# Patient Record
Sex: Male | Born: 1940 | Race: White | Hispanic: No | Marital: Married | Smoking: Current some day smoker
Health system: Southern US, Community
[De-identification: ages and names within clinical notes are randomized; demographics above are authoritative.]

## PROBLEM LIST (undated history)

## (undated) DIAGNOSIS — E785 Hyperlipidemia, unspecified: Secondary | ICD-10-CM

## (undated) DIAGNOSIS — F329 Major depressive disorder, single episode, unspecified: Secondary | ICD-10-CM

## (undated) DIAGNOSIS — E119 Type 2 diabetes mellitus without complications: Secondary | ICD-10-CM

## (undated) DIAGNOSIS — F419 Anxiety disorder, unspecified: Secondary | ICD-10-CM

## (undated) DIAGNOSIS — I714 Abdominal aortic aneurysm, without rupture, unspecified: Secondary | ICD-10-CM

## (undated) DIAGNOSIS — N433 Hydrocele, unspecified: Secondary | ICD-10-CM

## (undated) DIAGNOSIS — R55 Syncope and collapse: Secondary | ICD-10-CM

## (undated) DIAGNOSIS — F32A Depression, unspecified: Secondary | ICD-10-CM

## (undated) DIAGNOSIS — I10 Essential (primary) hypertension: Secondary | ICD-10-CM

## (undated) HISTORY — PX: BLEPHAROPLASTY: SUR158

## (undated) HISTORY — DX: Abdominal aortic aneurysm, without rupture: I71.4

## (undated) HISTORY — DX: Hydrocele, unspecified: N43.3

## (undated) HISTORY — DX: Type 2 diabetes mellitus without complications: E11.9

## (undated) HISTORY — DX: Anxiety disorder, unspecified: F41.9

## (undated) HISTORY — DX: Syncope and collapse: R55

## (undated) HISTORY — DX: Depression, unspecified: F32.A

## (undated) HISTORY — DX: Hyperlipidemia, unspecified: E78.5

## (undated) HISTORY — DX: Essential (primary) hypertension: I10

## (undated) HISTORY — DX: Abdominal aortic aneurysm, without rupture, unspecified: I71.40

## (undated) HISTORY — DX: Major depressive disorder, single episode, unspecified: F32.9

---

## 1970-10-31 HISTORY — PX: TONSILECTOMY, ADENOIDECTOMY, BILATERAL MYRINGOTOMY AND TUBES: SHX2538

## 2012-10-31 HISTORY — PX: FACIAL COSMETIC SURGERY: SHX629

## 2013-12-12 ENCOUNTER — Other Ambulatory Visit (HOSPITAL_COMMUNITY): Payer: Self-pay | Admitting: Internal Medicine

## 2013-12-12 DIAGNOSIS — K3184 Gastroparesis: Secondary | ICD-10-CM

## 2013-12-24 ENCOUNTER — Encounter (HOSPITAL_COMMUNITY)
Admission: RE | Admit: 2013-12-24 | Discharge: 2013-12-24 | Disposition: A | Discharge status: Home / Self Care | Payer: Medicare Other | Source: Ambulatory Visit | Attending: Internal Medicine | Admitting: Internal Medicine

## 2013-12-24 DIAGNOSIS — K3184 Gastroparesis: Secondary | ICD-10-CM | POA: Insufficient documentation

## 2013-12-24 MED ORDER — TECHNETIUM TC 99M SULFUR COLLOID
2.0000 | Freq: Once | INTRAVENOUS | Status: AC | PRN
  Administered 2013-12-24: 2 via INTRAVENOUS

## 2015-05-07 ENCOUNTER — Encounter: Payer: Self-pay | Admitting: Internal Medicine

## 2015-05-07 ENCOUNTER — Other Ambulatory Visit (INDEPENDENT_AMBULATORY_CARE_PROVIDER_SITE_OTHER): Payer: Medicare Other

## 2015-05-07 ENCOUNTER — Ambulatory Visit (INDEPENDENT_AMBULATORY_CARE_PROVIDER_SITE_OTHER): Payer: Medicare Other | Admitting: Internal Medicine

## 2015-05-07 VITALS — BP 130/80 | HR 80 | Temp 97.6°F | Ht 73.75 in | Wt 218.5 lb

## 2015-05-07 DIAGNOSIS — I714 Abdominal aortic aneurysm, without rupture, unspecified: Secondary | ICD-10-CM

## 2015-05-07 DIAGNOSIS — N508 Other specified disorders of male genital organs: Secondary | ICD-10-CM

## 2015-05-07 DIAGNOSIS — E119 Type 2 diabetes mellitus without complications: Secondary | ICD-10-CM

## 2015-05-07 DIAGNOSIS — E785 Hyperlipidemia, unspecified: Secondary | ICD-10-CM | POA: Insufficient documentation

## 2015-05-07 DIAGNOSIS — N442 Benign cyst of testis: Secondary | ICD-10-CM

## 2015-05-07 DIAGNOSIS — N433 Hydrocele, unspecified: Secondary | ICD-10-CM

## 2015-05-07 DIAGNOSIS — Z23 Encounter for immunization: Secondary | ICD-10-CM

## 2015-05-07 DIAGNOSIS — I10 Essential (primary) hypertension: Secondary | ICD-10-CM

## 2015-05-07 LAB — COMPREHENSIVE METABOLIC PANEL
ALT: 14 U/L (ref 0–53)
AST: 11 U/L (ref 0–37)
Albumin: 4.3 g/dL (ref 3.5–5.2)
Alkaline Phosphatase: 66 U/L (ref 39–117)
BUN: 14 mg/dL (ref 6–23)
CALCIUM: 10 mg/dL (ref 8.4–10.5)
CHLORIDE: 102 meq/L (ref 96–112)
CO2: 28 mEq/L (ref 19–32)
Creatinine, Ser: 1.18 mg/dL (ref 0.40–1.50)
GFR: 64.58 mL/min (ref >60.00)
Glucose, Bld: 183 mg/dL — ABNORMAL HIGH (ref 70–99)
POTASSIUM: 4.4 meq/L (ref 3.5–5.1)
Sodium: 139 mEq/L (ref 135–145)
TOTAL PROTEIN: 7.2 g/dL (ref 6.0–8.3)
Total Bilirubin: 0.5 mg/dL (ref 0.2–1.2)

## 2015-05-07 LAB — LIPID PANEL
CHOL/HDL RATIO: 3
Cholesterol: 152 mg/dL (ref 0–200)
HDL: 48.5 mg/dL (ref >39.00)
LDL Cholesterol: 88 mg/dL (ref 0–99)
NONHDL: 103.5
Triglycerides: 76 mg/dL (ref 0.0–149.0)
VLDL: 15.2 mg/dL (ref 0.0–40.0)

## 2015-05-07 LAB — HEMOGLOBIN A1C: HEMOGLOBIN A1C: 8.1 % — AB (ref 4.6–6.5)

## 2015-05-07 NOTE — Progress Notes (Signed)
   Subjective:    Patient ID: Dwayne Barnett, male    DOB: 1941/10/12, 74 y.o.   MRN: [id number]  HPI The patient is a 74 YO man who is coming in for treatment of his diabetes as well as a few other concerns. He had scrotal US at [facility] in February with a cyst on a testicle and it needed follow up in about 6 months and this is almost due. He also has a AAA which he cannot remember the size or when it was last checked. He is a formed smoker and stopped about 2 months ago. He does take oral medicines for his diabetes. Recent eye exam normal. No numbness or burning in his feet. No known complications that he knows of. No hypoglycemia or hyperglycemia. Last HgA1c 7.2 from February of this year.   PMH, [facility], social history reviewed and updated at visit.   Review of Systems  Constitutional: Negative for fever, activity change, appetite change, fatigue and unexpected weight change.  HENT: Negative.   Eyes: Negative.   Respiratory: Negative for cough, chest tightness, shortness of breath and wheezing.   Cardiovascular: Negative for chest pain, palpitations and leg swelling.  Gastrointestinal: Negative for nausea, abdominal pain, diarrhea, constipation and abdominal distention.       Rare GERD  Musculoskeletal: Negative.   Skin: Negative.   Neurological: Negative.   Psychiatric/Behavioral: Negative.       Objective:   Physical Exam  Constitutional: He is oriented to person, place, and time. He appears well-developed and well-nourished.  HENT:  Head: Normocephalic and atraumatic.  Eyes: EOM are normal.  Neck: Normal range of motion.  Cardiovascular: Normal rate and regular rhythm.   Carotids without bruit  Pulmonary/Chest: Effort normal and breath sounds normal. No respiratory distress. He has no wheezes. He has no rales.  Abdominal: Soft. Bowel sounds are normal. He exhibits no distension. There is no tenderness. There is no rebound.  Musculoskeletal: He exhibits no edema.  Neurological: He is  alert and oriented to person, place, and time. Coordination normal.  Skin: Skin is warm and dry.  Psychiatric: He has a normal mood and affect.   Filed Vitals:   05/07/15 0830  BP: 130/80  Pulse: 80  Temp: 97.6 F (36.4 C)  TempSrc: Oral  Height: 6' 1.75" (1.873 m)  Weight: 218 lb 8 oz (99.111 kg)  SpO2: 95%      Assessment & Plan:  Prevnar 13 given at visit.

## 2015-05-07 NOTE — Progress Notes (Signed)
Pre visit review using our clinic review tool, if applicable. No additional management support is needed unless otherwise documented below in the visit note. 

## 2015-05-07 NOTE — Assessment & Plan Note (Signed)
.  5cm by .6cm left testicle simple cyst. Also epididymis with .7cm by .5cm cyst on imaging from [facility] 12/12/14 with need for follow up in 6 months. Will order today. No pain.

## 2015-05-07 NOTE — Assessment & Plan Note (Signed)
BP at goal today. Continue lisinopril 40 mg daily and metoprolol 50 mg BID. Checking BMP and adjust as needed.

## 2015-05-07 NOTE — Assessment & Plan Note (Signed)
Currently taking metformin 1000 mg bid and januvia. Checking HgA1c and BMP today. Eye exam done for the year will obtain. Foot exam today normal. On ACE-I. No known complications.

## 2015-05-07 NOTE — Assessment & Plan Note (Signed)
Checking for increase in size with ultrasound. Can place referral to urology if he wants to talk about intervention. He will think about and let me know.

## 2015-05-07 NOTE — Assessment & Plan Note (Signed)
Continue crestor 40 mg daily, checking lipid panel and adjust as needed.

## 2015-05-07 NOTE — Assessment & Plan Note (Signed)
He cannot remember the last time this was checked or the size. He is a former smoker (quit 2 months ago). No signs of rupture on exam today and not palpable.

## 2015-05-07 NOTE — Patient Instructions (Signed)
We will check the blood work today and call you back with the results.   We have ordered the ultrasound to check the abdominal aortic aneurysm for size. We have also ordered the ultrasound of the scrotum to follow up on the cyst.   We will see you back in about 6 months. If you have problems or questions sooner please call the office.   Diabetes and Standards of Medical Care Diabetes is complicated. You may find that your diabetes team includes a dietitian, nurse, diabetes educator, eye doctor, and more. To help everyone know what is going on and to help you get the care you deserve, the following schedule of care was developed to help keep you on track. Below are the tests, exams, vaccines, medicines, education, and plans you will need. HbA1c test This test shows how well you have controlled your glucose over the past 2-3 months. It is used to see if your diabetes management plan needs to be adjusted.   It is performed at least 2 times a year if you are meeting treatment goals.  It is performed 4 times a year if therapy has changed or if you are not meeting treatment goals. Blood pressure test  This test is performed at every routine medical visit. The goal is less than 140/90 mm Hg for most people, but 130/80 mm Hg in some cases. Ask your health care provider about your goal. Dental exam  Follow up with the dentist regularly. Eye exam  If you are diagnosed with type 1 diabetes as a child, get an exam upon reaching the age of 12 years or older and have had diabetes for 3-5 years. Yearly eye exams are recommended after that initial eye exam.  If you are diagnosed with type 1 diabetes as an adult, get an exam within 5 years of diagnosis and then yearly.  If you are diagnosed with type 2 diabetes, get an exam as soon as possible after the diagnosis and then yearly. Foot care exam  Visual foot exams are performed at every routine medical visit. The exams check for cuts, injuries, or other  problems with the feet.  A comprehensive foot exam should be done yearly. This includes visual inspection as well as assessing foot pulses and testing for loss of sensation.  Check your feet nightly for cuts, injuries, or other problems with your feet. Tell your health care provider if anything is not healing. Kidney function test (urine microalbumin)  This test is performed once a year.  Type 1 diabetes: The first test is performed 5 years after diagnosis.  Type 2 diabetes: The first test is performed at the time of diagnosis.  A serum creatinine and estimated glomerular filtration rate (eGFR) test is done once a year to assess the level of chronic kidney disease (CKD), if present. Lipid profile (cholesterol, HDL, LDL, triglycerides)  Performed every 5 years for most people.  The goal for LDL is less than 100 mg/dL. If you are at high risk, the goal is less than 70 mg/dL.  The goal for HDL is 40 mg/dL-50 mg/dL for men and 50 mg/dL-60 mg/dL for women. An HDL cholesterol of 60 mg/dL or higher gives some protection against heart disease.  The goal for triglycerides is less than 150 mg/dL. Influenza vaccine, pneumococcal vaccine, and hepatitis B vaccine  The influenza vaccine is recommended yearly.  It is recommended that people with diabetes who are over 37 years old get the pneumonia vaccine. In some cases, two separate  shots may be given. Ask your health care provider if your pneumonia vaccination is up to date.  The hepatitis B vaccine is also recommended for adults with diabetes. Diabetes self-management education  Education is recommended at diagnosis and ongoing as needed. Treatment plan  Your treatment plan is reviewed at every medical visit. Document Released: 08/14/2009 Document Revised: 03/03/2014 Document Reviewed: 03/19/2013 [facility] Patient Information 2015 [place], [place]. This information is not intended to replace advice given to you by your health care provider.  Make sure you discuss any questions you have with your health care provider.

## 2015-05-11 ENCOUNTER — Encounter: Payer: Self-pay | Admitting: Internal Medicine

## 2015-05-12 ENCOUNTER — Encounter: Payer: Self-pay | Admitting: Internal Medicine

## 2015-05-12 ENCOUNTER — Other Ambulatory Visit: Payer: Self-pay | Admitting: Geriatric Medicine

## 2015-05-12 MED ORDER — METFORMIN HCL 1000 MG PO TABS: 1000.0000 mg | ORAL_TABLET | Freq: Two times a day (BID) | ORAL | Status: AC

## 2015-05-15 ENCOUNTER — Ambulatory Visit
Admission: RE | Admit: 2015-05-15 | Discharge: 2015-05-15 | Disposition: A | Discharge status: Home / Self Care | Payer: Medicare Other | Source: Ambulatory Visit | Attending: Internal Medicine | Admitting: Internal Medicine

## 2015-05-15 DIAGNOSIS — N442 Benign cyst of testis: Secondary | ICD-10-CM

## 2015-07-07 ENCOUNTER — Telehealth: Payer: Self-pay | Admitting: Internal Medicine

## 2015-07-07 NOTE — Telephone Encounter (Signed)
Pt called to let you know that [facility] will be sending request in for Crestor and Wellbutrin

## 2015-08-26 ENCOUNTER — Telehealth: Payer: Self-pay | Admitting: *Deleted

## 2015-08-26 NOTE — Telephone Encounter (Signed)
Left msg on triage stating saw md back in July did not have PSA check with labs, and haven't had the abd. Aortic check in a year. Would like to have these test done. Pls advise...Dwayne Barnett

## 2015-08-27 NOTE — Telephone Encounter (Signed)
Called pt gave md response. Made appt for 09/01/15.Marland KitchenJohny Barnett

## 2015-08-27 NOTE — Telephone Encounter (Signed)
Pls advise on msg below.../lmb 

## 2015-08-27 NOTE — Telephone Encounter (Signed)
The PSA is not recommended as a screening lab, if he has an indication for it otherwise he should have brought that up at the visit. Would need a visit to discuss the indication. We have not gotten his records and he was unable to tell us the last time when his abd aneurysm was checked last.

## 2015-09-01 ENCOUNTER — Ambulatory Visit: Payer: Medicare Other | Admitting: Internal Medicine

## 2015-09-03 ENCOUNTER — Encounter: Payer: Self-pay | Admitting: Internal Medicine

## 2015-09-03 ENCOUNTER — Ambulatory Visit (INDEPENDENT_AMBULATORY_CARE_PROVIDER_SITE_OTHER): Payer: Medicare Other | Admitting: Internal Medicine

## 2015-09-03 ENCOUNTER — Other Ambulatory Visit (INDEPENDENT_AMBULATORY_CARE_PROVIDER_SITE_OTHER): Payer: Medicare Other

## 2015-09-03 VITALS — BP 150/82 | HR 89 | Temp 98.2°F | Resp 14 | Ht 73.5 in | Wt 211.4 lb

## 2015-09-03 DIAGNOSIS — Z23 Encounter for immunization: Secondary | ICD-10-CM

## 2015-09-03 DIAGNOSIS — I714 Abdominal aortic aneurysm, without rupture, unspecified: Secondary | ICD-10-CM

## 2015-09-03 DIAGNOSIS — Z1211 Encounter for screening for malignant neoplasm of colon: Secondary | ICD-10-CM | POA: Diagnosis not present

## 2015-09-03 DIAGNOSIS — R972 Elevated prostate specific antigen [PSA]: Secondary | ICD-10-CM | POA: Insufficient documentation

## 2015-09-03 LAB — PSA: PSA: 4.99 ng/mL — ABNORMAL HIGH (ref 0.10–4.00)

## 2015-09-03 NOTE — Assessment & Plan Note (Addendum)
Still have not gotten records but probably time for US aorta for screening and have ordered today. Unknown last date of study and unknown last size.

## 2015-09-03 NOTE — Assessment & Plan Note (Signed)
Checking PSA today, referral to urology if needed. Has had biopsy in the past.

## 2015-09-03 NOTE — Progress Notes (Signed)
   Subjective:    Patient ID: Dwayne Barnett, male    DOB: 1941-02-07, 74 y.o.   MRN: [id number]  HPI The patient is a 74 YO man coming in with several concerns. His first is his AAA which has not been checked in some time (possibly several years although he is unclear about size and when it was checked). He was supposed to get it at the [place] but this was delayed. The second is that he did not get a PSA with his labs last visit. He has had elevations in the past and biopsy in the past. His brother has problems with prostate cancer and he is very concerned about it. The next thing is that he wants a colonoscopy and has not had one in more than 10 years (not sure when the last one was but had a couple benign polyps removed, done in [place]).   Review of Systems  Constitutional: Negative for fever, activity change, appetite change, fatigue and unexpected weight change.  Respiratory: Negative for cough, chest tightness, shortness of breath and wheezing.   Cardiovascular: Negative for chest pain, palpitations and leg swelling.  Gastrointestinal: Negative for nausea, abdominal pain, diarrhea, constipation and abdominal distention.  Musculoskeletal: Negative.   Skin: Negative.   Neurological: Negative.   Psychiatric/Behavioral: Negative.      Objective:   Physical Exam  Constitutional: He is oriented to person, place, and time. He appears well-developed and well-nourished.  HENT:  Head: Normocephalic and atraumatic.  Eyes: EOM are normal.  Neck: Normal range of motion.  Cardiovascular: Normal rate and regular rhythm.   Carotids without bruit  Pulmonary/Chest: Effort normal and breath sounds normal. No respiratory distress. He has no wheezes. He has no rales.  Abdominal: Soft. Bowel sounds are normal. He exhibits no distension and no mass. There is no tenderness. There is no rebound.  Musculoskeletal: He exhibits no edema.  Neurological: He is alert and oriented to person, place, and time.  Coordination normal.  Skin: Skin is warm and dry.  Psychiatric: He has a normal mood and affect.   Filed Vitals:   09/03/15 1103  BP: 150/82  Pulse: 89  Temp: 98.2 F (36.8 C)  TempSrc: Oral  Resp: 14  Height: 6' 1.5" (1.867 m)  Weight: 211 lb 6.4 oz (95.89 kg)  SpO2: 98%      Assessment & Plan:  Flu and tdap given at visit.

## 2015-09-03 NOTE — Progress Notes (Signed)
Pre visit review using our clinic review tool, if applicable. No additional management support is needed unless otherwise documented below in the visit note. 

## 2015-09-03 NOTE — Patient Instructions (Signed)
We will check the PSA today and call you back with the results.   We will make sure that you get the ultrasound here as well as the colonoscopy.

## 2015-09-04 DIAGNOSIS — Z23 Encounter for immunization: Secondary | ICD-10-CM

## 2015-09-04 NOTE — Addendum Note (Signed)
Addended by: Resa Miner R on: 09/04/2015 04:02 PM   Modules accepted: Orders

## 2015-09-11 ENCOUNTER — Ambulatory Visit
Admission: RE | Admit: 2015-09-11 | Discharge: 2015-09-11 | Disposition: A | Discharge status: Home / Self Care | Payer: Medicare Other | Source: Ambulatory Visit | Attending: Internal Medicine | Admitting: Internal Medicine

## 2015-09-11 DIAGNOSIS — I714 Abdominal aortic aneurysm, without rupture, unspecified: Secondary | ICD-10-CM

## 2015-09-14 ENCOUNTER — Encounter: Payer: Self-pay | Admitting: Internal Medicine

## 2015-11-06 ENCOUNTER — Other Ambulatory Visit (INDEPENDENT_AMBULATORY_CARE_PROVIDER_SITE_OTHER): Payer: Medicare Other

## 2015-11-06 ENCOUNTER — Encounter: Payer: Self-pay | Admitting: Internal Medicine

## 2015-11-06 ENCOUNTER — Ambulatory Visit (INDEPENDENT_AMBULATORY_CARE_PROVIDER_SITE_OTHER): Payer: Medicare Other | Admitting: Internal Medicine

## 2015-11-06 VITALS — BP 130/80 | HR 86 | Temp 98.4°F | Resp 20 | Ht 74.0 in | Wt 217.0 lb

## 2015-11-06 DIAGNOSIS — E119 Type 2 diabetes mellitus without complications: Secondary | ICD-10-CM

## 2015-11-06 LAB — HEMOGLOBIN A1C: HEMOGLOBIN A1C: 8.2 % — AB (ref 4.6–6.5)

## 2015-11-06 NOTE — Patient Instructions (Signed)
We will check the labs for the diabetes today and call you back with the results.   Diabetes and Standards of Medical Care Diabetes is complicated. You may find that your diabetes team includes a dietitian, nurse, diabetes educator, eye doctor, and more. To help everyone know what is going on and to help you get the care you deserve, the following schedule of care was developed to help keep you on track. Below are the tests, exams, vaccines, medicines, education, and plans you will need. HbA1c test This test shows how well you have controlled your glucose over the past 2-3 months. It is used to see if your diabetes management plan needs to be adjusted.   It is performed at least 2 times a year if you are meeting treatment goals.  It is performed 4 times a year if therapy has changed or if you are not meeting treatment goals. Blood pressure test  This test is performed at every routine medical visit. The goal is less than 140/90 mm Hg for most people, but 130/80 mm Hg in some cases. Ask your health care provider about your goal. Dental exam  Follow up with the dentist regularly. Eye exam  If you are diagnosed with type 1 diabetes as a child, get an exam upon reaching the age of 97 years or older and having had diabetes for 3-5 years. Yearly eye exams are recommended after that initial eye exam.  If you are diagnosed with type 1 diabetes as an adult, get an exam within 5 years of diagnosis and then yearly.  If you are diagnosed with type 2 diabetes, get an exam as soon as possible after the diagnosis and then yearly. Foot care exam  Visual foot exams are performed at every routine medical visit. The exams check for cuts, injuries, or other problems with the feet.  You should have a complete foot exam performed every year. This exam includes an inspection of the structure and skin of your feet, a check of the pulses in your feet, and a check of the sensation in your feet.  Type 1 diabetes:  The first exam is performed 5 years after diagnosis.  Type 2 diabetes: The first exam is performed at the time of diagnosis.  Check your feet nightly for cuts, injuries, or other problems with your feet. Tell your health care provider if anything is not healing. Kidney function test (urine microalbumin)  This test is performed once a year.  Type 1 diabetes: The first test is performed 5 years after diagnosis.  Type 2 diabetes: The first test is performed at the time of diagnosis.  A serum creatinine and estimated glomerular filtration rate (eGFR) test is done once a year to assess the level of chronic kidney disease (CKD), if present. Lipid profile (cholesterol, HDL, LDL, triglycerides)  Performed every 5 years for most people.  The goal for LDL is less than 100 mg/dL. If you are at high risk, the goal is less than 70 mg/dL.  The goal for HDL is 40 mg/dL-50 mg/dL for men and 50 mg/dL-60 mg/dL for women. An HDL cholesterol of 60 mg/dL or higher gives some protection against heart disease.  The goal for triglycerides is less than 150 mg/dL. Immunizations  The flu (influenza) vaccine is recommended yearly for every person 41 months of age or older who has diabetes.  The pneumonia (pneumococcal) vaccine is recommended for every person 35 years of age or older who has diabetes. Adults 27 years of age  or older may receive the pneumonia vaccine as a series of two separate shots.  The hepatitis B vaccine is recommended for adults shortly after they have been diagnosed with diabetes.  The Tdap (tetanus, diphtheria, and pertussis) vaccine should be given:  According to normal childhood vaccination schedules, for children.  Every 10 years, for adults who have diabetes. Diabetes self-management education  Education is recommended at diagnosis and ongoing as needed. Treatment plan  Your treatment plan is reviewed at every medical visit.   This information is not intended to replace advice  given to you by your health care provider. Make sure you discuss any questions you have with your health care provider.   Document Released: 08/14/2009 Document Revised: 11/07/2014 Document Reviewed: 03/19/2013 Elsevier Interactive Patient Education [employer].

## 2015-11-06 NOTE — Progress Notes (Signed)
   Subjective:    Patient ID: Dwayne Barnett, male    DOB: 09-25-1941, 75 y.o.   MRN: [id number]  HPI The patient is a 75 YO man coming in for follow up of his diabetes. He is still taking his medicine and exercising 3 times per week. No numbness in his feet, gets his eyes checked yearly. No new problems. No low sugars.   Review of Systems  Constitutional: Negative for fever, activity change, appetite change, fatigue and unexpected weight change.  Respiratory: Negative for cough, chest tightness, shortness of breath and wheezing.   Cardiovascular: Negative for chest pain, palpitations and leg swelling.  Gastrointestinal: Negative for nausea, abdominal pain, diarrhea, constipation and abdominal distention.  Musculoskeletal: Negative.   Skin: Negative.   Neurological: Negative.   Psychiatric/Behavioral: Negative.       Objective:   Physical Exam  Constitutional: He is oriented to person, place, and time. He appears well-developed and well-nourished.  HENT:  Head: Normocephalic and atraumatic.  Eyes: EOM are normal.  Neck: Normal range of motion.  Cardiovascular: Normal rate and regular rhythm.   Carotids without bruit  Pulmonary/Chest: Effort normal and breath sounds normal. No respiratory distress. He has no wheezes. He has no rales.  Abdominal: Soft. Bowel sounds are normal. He exhibits no distension and no mass. There is no tenderness. There is no rebound.  Musculoskeletal: He exhibits no edema.  Neurological: He is alert and oriented to person, place, and time. Coordination normal.  Skin: Skin is warm and dry.  Psychiatric: He has a normal mood and affect.   Filed Vitals:   11/06/15 1012  BP: 130/80  Pulse: 86  Temp: 98.4 F (36.9 C)  TempSrc: Oral  Resp: 20  Height: '6\' 2"'$  (1.88 m)  Weight: 217 lb (98.431 kg)  SpO2: 98%      Assessment & Plan:

## 2015-11-06 NOTE — Assessment & Plan Note (Signed)
Checking HgA1c, on metformin and januvia. Not complicated. On ACE-I and statin.

## 2015-11-06 NOTE — Progress Notes (Signed)
Pre visit review using our clinic review tool, if applicable. No additional management support is needed unless otherwise documented below in the visit note. 

## 2015-11-19 ENCOUNTER — Other Ambulatory Visit: Payer: Self-pay | Admitting: Geriatric Medicine

## 2015-11-19 ENCOUNTER — Telehealth: Payer: Self-pay

## 2015-11-19 MED ORDER — LISINOPRIL 40 MG PO TABS: 40.0000 mg | ORAL_TABLET | Freq: Every day | ORAL | Status: AC

## 2015-11-19 NOTE — Telephone Encounter (Signed)
[facility] r[facility] for lisinopril 90 day supply

## 2015-11-19 NOTE — Telephone Encounter (Signed)
Sent to pharmacy 

## 2015-11-24 ENCOUNTER — Encounter: Payer: Medicare Other | Admitting: Internal Medicine

## 2016-01-21 ENCOUNTER — Encounter: Payer: Self-pay | Admitting: Internal Medicine

## 2016-01-21 ENCOUNTER — Telehealth: Payer: Self-pay

## 2016-01-21 ENCOUNTER — Ambulatory Visit (INDEPENDENT_AMBULATORY_CARE_PROVIDER_SITE_OTHER): Payer: Medicare Other | Admitting: Internal Medicine

## 2016-01-21 VITALS — BP 132/62 | HR 79 | Temp 97.8°F | Resp 12 | Ht 73.5 in | Wt 213.4 lb

## 2016-01-21 DIAGNOSIS — IMO0001 Reserved for inherently not codable concepts without codable children: Secondary | ICD-10-CM | POA: Insufficient documentation

## 2016-01-21 DIAGNOSIS — Z0000 Encounter for general adult medical examination without abnormal findings: Secondary | ICD-10-CM | POA: Diagnosis not present

## 2016-01-21 DIAGNOSIS — E119 Type 2 diabetes mellitus without complications: Secondary | ICD-10-CM

## 2016-01-21 DIAGNOSIS — R143 Flatulence: Secondary | ICD-10-CM | POA: Diagnosis not present

## 2016-01-21 DIAGNOSIS — Z87891 Personal history of nicotine dependence: Secondary | ICD-10-CM

## 2016-01-21 DIAGNOSIS — N433 Hydrocele, unspecified: Secondary | ICD-10-CM

## 2016-01-21 NOTE — Patient Instructions (Addendum)
We will send you to the urologist and the GI doctor.   We will have you decrease the metformin to 1/2 pill twice a day to see if that improves the gas.    Dwayne Barnett , Thank you for taking time to come for your Medicare Wellness Visit. I appreciate your ongoing commitment to your health goals. Please review the following plan we discussed and let me know if I can assist you in the future.   [name] (Primary care doctor on [address] [facility]  Address: [address], [place], [place] [address] Phone: [phone]  Will check with [name] regarding Zostavax Educated to check with insurance regarding coverage of Shingles vaccination on Part D or Part B and may have lower co-pay if provided on the Part D side  Important Safety Information ZOSTAVAX does not protect everyone, so some people who get the vaccine may still get Shingles.  You should not get ZOSTAVAX if you are allergic to any of its ingredients, including gelatin or neomycin, have a weakened immune system, take high doses of steroids, or are pregnant or plan to become pregnant. You should not get ZOSTAVAX to prevent chickenpox.  Talk to your health care professional if you plan to get ZOSTAVAX at the same time as PNEUMOVAX23 (Pneumococcal Vaccine Polyvalent) because it may be better to get these vaccines at least 4 weeks apart.  Possible side effects include redness, pain, itching, swelling, hard lump, warmth, or bruising at the injection site, as well as headache.  ZOSTAVAX contains a weakened chickenpox virus. Tell your health care professional if you will be in close contact with newborn infants, someone who may be pregnant and has not had chickenpox or been vaccinated against chickenpox, or someone who has problems with their immune system. Your health care professional can tell you what situations you may need to avoid. You are encouraged to report negative side effects of prescription drugs to the FDA. Visit  SmoothHits.hu, or call 1-800-FDA-1088.  Goal:  To stay hit fit and continue to stop smoking  These are the goals we discussed: Goals    None      This is a list of the screening recommended for you and due dates:  Health Maintenance  Topic Date Due  . Shingles Vaccine  10/22/2003  . Hemoglobin A1C  05/05/2016  . Complete foot exam   05/06/2016  . Flu Shot  05/31/2016  . Eye exam for diabetics  01/13/2017  . Colon Cancer Screening  01/12/2021  . Tetanus Vaccine  09/02/2025  . Pneumonia vaccines  Completed    Health Maintenance, Male A healthy lifestyle and preventative care can promote health and wellness.  Maintain regular health, dental, and eye exams.  Eat a healthy diet. Foods like vegetables, fruits, whole grains, low-fat dairy products, and lean protein foods contain the nutrients you need and are low in calories. Decrease your intake of foods high in solid fats, added sugars, and salt. Get information about a proper diet from your health care provider, if necessary.  Regular physical exercise is one of the most important things you can do for your health. Most adults should get at least 150 minutes of moderate-intensity exercise (any activity that increases your heart rate and causes you to sweat) each week. In addition, most adults need muscle-strengthening exercises on 2 or more days a week.   Maintain a healthy weight. The body mass index (BMI) is a screening tool to identify possible weight problems.  It provides an estimate of body fat based on height and weight. Your health care provider can find your BMI and can help you achieve or maintain a healthy weight. For males 20 years and older:  A BMI below 18.5 is considered underweight.  A BMI of 18.5 to 24.9 is normal.  A BMI of 25 to 29.9 is considered overweight.  A BMI of 30 and above is considered obese.  Maintain normal blood lipids and cholesterol by exercising and minimizing your intake of saturated  fat. Eat a balanced diet with plenty of fruits and vegetables. Blood tests for lipids and cholesterol should begin at age 54 and be repeated every 5 years. If your lipid or cholesterol levels are high, you are over age 41, or you are at high risk for heart disease, you may need your cholesterol levels checked more frequently.Ongoing high lipid and cholesterol levels should be treated with medicines if diet and exercise are not working.  If you smoke, find out from your health care provider how to quit. If you do not use tobacco, do not start.  Lung cancer screening is recommended for adults aged 46-80 years who are at high risk for developing lung cancer because of a history of smoking. A yearly low-dose CT scan of the lungs is recommended for people who have at least a 30-pack-year history of smoking and are current smokers or have quit within the past 15 years. A pack year of smoking is smoking an average of 1 pack of cigarettes a day for 1 year (for example, a 30-pack-year history of smoking could mean smoking 1 pack a day for 30 years or 2 packs a day for 15 years). Yearly screening should continue until the smoker has stopped smoking for at least 15 years. Yearly screening should be stopped for people who develop a health problem that would prevent them from having lung cancer treatment.  If you choose to drink alcohol, do not have more than 2 drinks per day. One drink is considered to be 12 oz (360 mL) of beer, 5 oz (150 mL) of wine, or 1.5 oz (45 mL) of liquor.  Avoid the use of street drugs. Do not share needles with anyone. Ask for help if you need support or instructions about stopping the use of drugs.  High blood pressure causes heart disease and increases the risk of stroke. High blood pressure is more likely to develop in:  People who have blood pressure in the end of the normal range (100-139/85-89 mm Hg).  People who are overweight or obese.  People who are African American.  If  you are 3-71 years of age, have your blood pressure checked every 3-5 years. If you are 50 years of age or older, have your blood pressure checked every year. You should have your blood pressure measured twice--once when you are at a hospital or clinic, and once when you are not at a hospital or clinic. Record the average of the two measurements. To check your blood pressure when you are not at a hospital or clinic, you can use:  An automated blood pressure machine at a pharmacy.  A home blood pressure monitor.  If you are 36-50 years old, ask your health care provider if you should take aspirin to prevent heart disease.  Diabetes screening involves taking a blood sample to check your fasting blood sugar level. This should be done once every 3 years after age 49 if you are at a normal weight  and without risk factors for diabetes. Testing should be considered at a younger age or be carried out more frequently if you are overweight and have at least 1 risk factor for diabetes.  Colorectal cancer can be detected and often prevented. Most routine colorectal cancer screening begins at the age of 3 and continues through age 69. However, your health care provider may recommend screening at an earlier age if you have risk factors for colon cancer. On a yearly basis, your health care provider may provide home test kits to check for hidden blood in the stool. A small camera at the end of a tube may be used to directly examine the colon (sigmoidoscopy or colonoscopy) to detect the earliest forms of colorectal cancer. Talk to your health care provider about this at age 1 when routine screening begins. A direct exam of the colon should be repeated every 5-10 years through age 7, unless early forms of precancerous polyps or small growths are found.  People who are at an increased risk for hepatitis B should be screened for this virus. You are considered at high risk for hepatitis B if:  You were born in a  country where hepatitis B occurs often. Talk with your health care provider about which countries are considered high risk.  Your parents were born in a high-risk country and you have not received a shot to protect against hepatitis B (hepatitis B vaccine).  You have HIV or AIDS.  You use needles to inject street drugs.  You live with, or have sex with, someone who has hepatitis B.  You are a man who has sex with other men (MSM).  You get hemodialysis treatment.  You take certain medicines for conditions like cancer, organ transplantation, and autoimmune conditions.  Hepatitis C blood testing is recommended for all people born from [birth year] through 1963 and any individual with known risk factors for hepatitis C.  Healthy men should no longer receive prostate-specific antigen (PSA) blood tests as part of routine cancer screening. Talk to your health care provider about prostate cancer screening.  Testicular cancer screening is not recommended for adolescents or adult males who have no symptoms. Screening includes self-exam, a health care provider exam, and other screening tests. Consult with your health care provider about any symptoms you have or any concerns you have about testicular cancer.  Practice safe sex. Use condoms and avoid high-risk sexual practices to reduce the spread of sexually transmitted infections (STIs).  You should be screened for STIs, including gonorrhea and chlamydia if:  You are sexually active and are younger than 24 years.  You are older than 24 years, and your health care provider tells you that you are at risk for this type of infection.  Your sexual activity has changed since you were last screened, and you are at an increased risk for chlamydia or gonorrhea. Ask your health care provider if you are at risk.  If you are at risk of being infected with HIV, it is recommended that you take a prescription medicine daily to prevent HIV infection. This is called  pre-exposure prophylaxis (PrEP). You are considered at risk if:  You are a man who has sex with other men (MSM).  You are a heterosexual man who is sexually active with multiple partners.  You take drugs by injection.  You are sexually active with a partner who has HIV.  Talk with your health care provider about whether you are at high risk of being infected with  HIV. If you choose to begin PrEP, you should first be tested for HIV. You should then be tested every 3 months for as long as you are taking PrEP.  Use sunscreen. Apply sunscreen liberally and repeatedly throughout the day. You should seek shade when your shadow is shorter than you. Protect yourself by wearing long sleeves, pants, a wide-brimmed hat, and sunglasses year round whenever you are outdoors.  Tell your health care provider of new moles or changes in moles, especially if there is a change in shape or color. Also, tell your health care provider if a mole is larger than the size of a pencil eraser.  A one-time screening for abdominal aortic aneurysm (AAA) and surgical repair of large AAAs by ultrasound is recommended for men aged 78-75 years who are current or former smokers.  Stay current with your vaccines (immunizations).   This information is not intended to replace advice given to you by your health care provider. Make sure you discuss any questions you have with your health care provider.   Document Released: 04/14/2008 Document Revised: 11/07/2014 Document Reviewed: 03/14/2011 Elsevier Interactive Patient Education [employer].

## 2016-01-21 NOTE — Telephone Encounter (Signed)
The patient agrees to LDCT for lung cancer screening/ states he smoked .8 cig a day x 50 years; quit off and on for the last 2 years; Quit last time 2 months ago.  Please advise

## 2016-01-21 NOTE — Assessment & Plan Note (Signed)
Referral to GI per patient preference although we talked about today it is likely coming from his metformin and we have cut his dose in half today.

## 2016-01-21 NOTE — Assessment & Plan Note (Signed)
Diabetes much improved with recent HgA1c at 6.6 and will cut back metformin to 1/2 pill BID to help with his gas and bloating.

## 2016-01-21 NOTE — Progress Notes (Signed)
Pre visit review using our clinic review tool, if applicable. No additional management support is needed unless otherwise documented below in the visit note. 

## 2016-01-21 NOTE — Progress Notes (Signed)
Subjective:   Dwayne Barnett is a 75 y.o. male who presents for Medicare Annual/Subsequent preventive examination.  Review of Systems:   HRA assessment completed during visit; [place]   The Patient was informed that this wellness visit is to identify risk and educate on how to reduce risk for increase disease through lifestyle changes.   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  Hyperlipidemia; 05/2015 chol 152; Trig 76 Exercise; ; HDL 48; LDL 88; ratio 3 HTN well managed medically AAA; 2.6x 3; to continue to check annually Will consider LDCT (1st wife died from breast cancer)  States he starts and stops, but last started    Tobacco: Former smoker/ started and stopped /never smoked a pack;  When asked, stated he has been smoking off and on x 2 years with a 40 pack year history;  States he smoked .7 to .8  ETOH/ no  BMI: 6.6 now;  Started on Januvia; Wasn't taking the metformin as he should;  Now he is taking metformin bid; may change per order today Stopped drinking OJ; not eating pastries;  Diet;  Cup of oatmeal; 2 eggs for breakfast; Chicken corn soup; (supper)  Fruit Eats walnuts; has ensure at times    Exercise and meditation tapes 1 to 2 hours x 3 times a week; Which helps PTSD  Vibrating rocker; breathing and listen to relaxation; Exercise; 30 minutes; (works all muscle groups)  Wt bench and weights;  Very active in 50's;    SAFETY; Lives with wife;  Safety reviewed for the home; lives in one level home  States he has a walk in shower; Fall prevention dicussed; caution when climbing which he may occasionally do  Warehouse manager; YES;  Smoke detectors yes Firearms safety / keep in safe  place  Driving accidents and seatbelt/no Sun protection; wears a hat or sun protection;  Stressors; at times but manages;   Medication review/  would like Cialis changed to '5mg'$  and sent to mail order. The '40mg'$  is to expensive and does not feel he needs this much  Fall assessment /  A year ago; problems with family; stressed; passed out and fell;  Another episode; was vomiting and passed out but was sick PTSD; [place] released him but they are a resource;  Falls related to emotional stress and understands how to manage them   Gait assessment/ good   Mobilization and Functional losses in the last year. none  Urinary or fecal incontinence reviewed May see GI; slow emptying; will try to cut back on Metformin pre Dr. Sharlet Salina   Counseling: Colonoscopy; had one at [place] about 75 yo (2012) and [place] in [place] about 75 yo/ will get record from GI listed below so the next exam can be scheduled as appropriate [name] (Primary care doctor on [address] in [facility]  Address: [address], [place], [place] [address] Phone: [phone] The patient will fup  EKG: had one at [place] x 75 yo on treadmill; '2000hz'$  both ears Hearing: some weak at frequencies  Ophthalmology exam; had eye exam last week; DR. Phineas Douglas; HP- vision is the same Cataract surgery bilateral 4 to 5 years  ago  Immunizations: Zostavax; educated and will consider  Scientist, physiological; Does not have one but still considering who will be his HCPOA. Has form at home  Health advice or referrals The patient will fup on Colonoscopy report from Dr. In [facility]  The patient will consider shingles.  The patient will take the LDCT exam due to 40 pack history and has quit smoking now for the last couple of months.   The patient request Cialis be changed to 5 mg; hoping copay will be less. He is taking for BPH and has not had this filled.   Current Care Team reviewed and updated Cardiac Risk Factors include: advanced age (>68mn, >>24 women);diabetes mellitus;dyslipidemia;hypertension;family history of premature cardiovascular disease/ smoking hx     Objective:    Vitals: BP 132/62 mmHg  Pulse 79  Temp(Src) 97.8 F (36.6 C) (Oral)  Resp 12  Ht 6' 1.5" (1.867 m)  Wt 213 lb 6.4 oz (96.798 kg)  BMI 27.77 kg/m2  SpO2 98%  Body mass index is 27.77 kg/(m^2).  Tobacco History  Smoking status  . Former Smoker -- 0.80 packs/day for 50 years  . Types: Cigarettes  Smokeless tobacco  . Not on file    Comment: started smoking at 75yo; quit off and on x 2 years; no cigerettes in 2 months      Counseling given: Yes   Past Medical History  Diagnosis Date  . Anxiety   . Diabetes mellitus without complication ([place]   . Depression   . Hyperlipidemia   . Hypertension   . AAA (abdominal aortic aneurysm) ([place]   . Syncope    Past Surgical History  Procedure Laterality Date  . Tonsilectomy, adenoidectomy, bilateral myringotomy and tubes Bilateral 1972  . Facial cosmetic surgery  2014    lower   Family History  Problem Relation Age of Onset  . Heart disease Mother   . Hyperlipidemia Mother   . Diabetes Mother   . Heart disease Father   . Hyperlipidemia Father   . Diabetes Sister    History  Sexual Activity  . Sexual Activity: Not on file    Outpatient Encounter Prescriptions as of 01/21/2016  Medication Sig  . aspirin 325 MG EC tablet Take 325 mg by mouth daily.  .Marland KitchenbuPROPion (WELLBUTRIN) 75 MG tablet Take by mouth.  . DHA-EPA-Vit B6-B12-Folic Acid (CARDIOVID PLUS) CAPS Take by mouth.  .Marland Kitchenlisinopril (PRINIVIL,ZESTRIL) 40 MG tablet Take 1 tablet (40 mg total) by mouth daily.  . metFORMIN (GLUCOPHAGE) 1000 MG tablet Take 1 tablet (1,000 mg total) by mouth 2 (two) times daily with a meal.  . niacin 100 MG tablet Take 100 mg by mouth at bedtime.  . pantoprazole (PROTONIX) 40 MG tablet Take 40 mg by mouth daily.  . rosuvastatin (CRESTOR) 40 MG tablet Take 40 mg by mouth daily.  . sitaGLIPtin (JANUVIA) 100 MG  tablet Take 100 mg by mouth daily.  . tadalafil (CIALIS) 20 MG tablet Take 20 mg by mouth daily as needed for erectile dysfunction.  . vitamin B-12 (CYANOCOBALAMIN) 250 MCG tablet Take 250 mcg by mouth daily.  . metoprolol (LOPRESSOR) 50 MG tablet Take 50 mg by mouth 2 (two) times daily. Reported on 01/21/2016   No facility-administered encounter medications on file as of 01/21/2016.    Activities of Daily Living In your present state of health, do you have any difficulty performing the following activities: 01/21/2016  Hearing? Y  Vision? N  Difficulty concentrating or making decisions? N  Walking or climbing stairs? N  Dressing or bathing? N  Doing errands, shopping? N  Preparing Food and eating ? N  Using the Toilet? N  In the past six months, have you accidently leaked urine? N  Do you have problems with loss of bowel control? N  Managing your Medications? (No Data)  Managing your Finances? N  Housekeeping or managing your Housekeeping? N    Patient Care Team: Hoyt Koch, MD as PCP - General (Internal Medicine)   Assessment:      Exercise Activities and Dietary recommendations Current Exercise Habits: Home exercise routine, Time (Minutes): 60, Frequency (Times/Week): 3, Weekly Exercise (Minutes/Week): 180, Intensity: Moderate  Goals    None     Fall Risk Fall Risk  01/21/2016 09/03/2015  Falls in the past year? Yes Yes  Number falls in past yr: 1 2 or more  Injury with Fall? - No  Follow up Education provided -   Depression Screen PHQ 2/9 Scores 01/21/2016 09/03/2015  PHQ - 2 Score 0 0    Cognitive Testing No flowsheet data found.  Immunization History  Administered Date(s) Administered  . Influenza,inj,Quad PF,36+ Mos 09/04/2015  . Influenza-Unspecified 07/26/2014  . Pneumococcal Conjugate-13 05/07/2015  . Pneumococcal Polysaccharide-23 06/11/2012  . Tdap 08/23/2005, 09/03/2015   Screening Tests Health Maintenance  Topic Date Due  . ZOSTAVAX   10/22/2003  . HEMOGLOBIN A1C  05/05/2016  . FOOT EXAM  05/06/2016  . INFLUENZA VACCINE  05/31/2016  . OPHTHALMOLOGY EXAM  01/13/2017  . COLONOSCOPY  01/12/2021  . TETANUS/TDAP  09/02/2025  . PNA vac Low Risk Adult  Completed      Plan:    The patient will fup on Colonoscopy report from Dr. In [facility]  The patient will consider shingles.  The patient will take the LDCT exam due to 40 pack history and has quit smoking now for the last couple of months.   The patient request Cialis be changed to 5 mg; hoping copay will be less. He is taking for BPH and has not had this filled.   During the course of the visit the patient was educated and counseled about the following appropriate screening and preventive services:   Vaccines to include Pneumoccal, Influenza, Hepatitis B, Td, Zostavax, HCV/ educated  Electrocardiogram/ had one a few years ago ? Stress test  Cardiovascular Disease/ BP and DM in good control with last A1c 6.6  Colorectal cancer screening the patient to get report from last study   Diabetes screening; diet is adequate; A1c per [facility] nurse was 6.6  Prostate Cancer Screening/ deferred  Glaucoma screening/ neg for glaucoma/ just had last week with Dr. Bettey Mare  Nutrition counseling / in good control at present  Smoking cessation counseling/ not smoking at present.  Agreed to LDCT to scan for screen for cancer per the [place] preventive task force  Patient Instructions (the written plan) was given to the patient.    Wynetta Fines, RN  01/21/2016

## 2016-01-21 NOTE — Assessment & Plan Note (Signed)
Referral to urology. [place] without change. Prior at other health system in care everywhere.

## 2016-01-21 NOTE — Progress Notes (Signed)
   Subjective:    Patient ID: Dwayne Barnett, male    DOB: [birth year], 75 y.o.   MRN: [id number]  HPI The patient is a 75 YO man coming in for wellness. Saw health nurse who checked HgA1c at his home yesterday which was 6.6. No new complaints. Still wants to see a urologist for his hydrocele and a GI doctor for some gas and bloating. Exercising some and eating better.   PMH, [facility], social history reviewed and updated.   Review of Systems  Constitutional: Negative for fever, activity change, appetite change, fatigue and unexpected weight change.  Eyes: Negative.   Respiratory: Negative for cough, chest tightness, shortness of breath and wheezing.   Cardiovascular: Negative for chest pain, palpitations and leg swelling.  Gastrointestinal: Positive for diarrhea. Negative for nausea, abdominal pain, constipation and abdominal distention.       Gas and bloating  Musculoskeletal: Negative.   Skin: Negative.   Neurological: Negative.   Psychiatric/Behavioral: Negative.       Objective:   Physical Exam  Constitutional: He is oriented to person, place, and time. He appears well-developed and well-nourished.  HENT:  Head: Normocephalic and atraumatic.  Eyes: EOM are normal.  Neck: Normal range of motion.  Cardiovascular: Normal rate and regular rhythm.   Carotids without bruit  Pulmonary/Chest: Effort normal and breath sounds normal. No respiratory distress. He has no wheezes. He has no rales.  Abdominal: Soft. Bowel sounds are normal. He exhibits no distension and no mass. There is no tenderness. There is no rebound and no guarding.  Musculoskeletal: He exhibits no edema.  Neurological: He is alert and oriented to person, place, and time. Coordination normal.  Skin: Skin is warm and dry.  Psychiatric: He has a normal mood and affect.   Filed Vitals:   01/21/16 1314  BP: 132/62  Pulse: 79  Temp: 97.8 F (36.6 C)  TempSrc: Oral  Resp: 12  Height: 6' 1.5" (1.867 m)  Weight: 213 lb 6.4  oz (96.798 kg)  SpO2: 98%      Assessment & Plan:

## 2016-01-22 NOTE — Progress Notes (Signed)
Medical screening examination/treatment/procedure(s) were performed by non-physician practitioner and as supervising physician I was immediately available for consultation/collaboration. I agree with above. Donyea Beverlin A Triston Skare, MD 

## 2016-01-25 ENCOUNTER — Other Ambulatory Visit: Payer: Self-pay | Admitting: Internal Medicine

## 2016-01-25 ENCOUNTER — Other Ambulatory Visit: Payer: Self-pay | Admitting: Acute Care

## 2016-01-25 DIAGNOSIS — Z87891 Personal history of nicotine dependence: Secondary | ICD-10-CM

## 2016-01-25 NOTE — Telephone Encounter (Signed)
Order placed for low dose CT screening.

## 2016-01-27 ENCOUNTER — Encounter: Payer: Self-pay | Admitting: Internal Medicine

## 2016-02-01 ENCOUNTER — Ambulatory Visit (INDEPENDENT_AMBULATORY_CARE_PROVIDER_SITE_OTHER): Payer: Medicare Other | Admitting: Acute Care

## 2016-02-01 ENCOUNTER — Ambulatory Visit (INDEPENDENT_AMBULATORY_CARE_PROVIDER_SITE_OTHER)
Admission: RE | Admit: 2016-02-01 | Discharge: 2016-02-01 | Disposition: A | Discharge status: Home / Self Care | Payer: Medicare Other | Source: Ambulatory Visit | Attending: Acute Care | Admitting: Acute Care

## 2016-02-01 ENCOUNTER — Encounter: Payer: Self-pay | Admitting: Acute Care

## 2016-02-01 DIAGNOSIS — Z87891 Personal history of nicotine dependence: Secondary | ICD-10-CM

## 2016-02-01 NOTE — Progress Notes (Signed)
Shared Decision Making Visit Lung Cancer Screening Program [phone])   Eligibility:  Age 75 y.o.  Pack Years Smoking History Calculation: 40 pack year smoking history (# packs/per year x # years smoked)  Recent History of coughing up blood  no  Unexplained weight loss? no ( >Than 15 pounds within the last 6 months )  Prior History Lung / other cancer no (Diagnosis within the last 5 years already requiring surveillance chest CT Scans).  Smoking Status Former  Former Smokers: Years since quit: < 1 year  Quit Date:12/28/15  Visit Components:  Discussion included one or more decision making aids. yes  Discussion included risk/benefits of screening. yes  Discussion included potential follow up diagnostic testing for abnormal scans. yes  Discussion included meaning and risk of over diagnosis. yes  Discussion included meaning and risk of False Positives. yes  Discussion included meaning of total radiation exposure. yes  Counseling Included:  Importance of adherence to annual lung cancer LDCT screening. yes  Impact of comorbidities on ability to participate in the program. yes  Ability and willingness to under diagnostic treatment. yes  Smoking Cessation Counseling:  Current Smokers:   Discussed importance of smoking cessation. NA Former smoker  Information about tobacco cessation classes and interventions provided to patient. NA  Patient provided with "ticket" for LDCT Scan. yes  Symptomatic Patient. no  Counseling: NA  Diagnosis Code: Tobacco Use Z72.0  Asymptomatic Patient yes  Counseling NA: Former smoker  Former Smokers:   Discussed the importance of maintaining cigarette abstinence. yes  Diagnosis Code: Personal History of Nicotine Dependence. B51.025  Information about tobacco cessation classes and interventions provided to patient. Yes  Patient provided with "ticket" for LDCT Scan. yes  Written Order for Lung Cancer Screening with LDCT placed in  [facility]. Yes (CT Chest Lung Cancer Screening Low Dose W/O CM) [id number] Z12.2-Screening of respiratory organs Z87.891-Personal history of nicotine dependence  I spent 20 minutes of face to face time with [name] discussing the risks and benefits of lung cancer screening. We viewed a power point together that explained in detail the above noted topics. We took the time to pause the power point at intervals to allow for questions to be asked and answered to ensure understanding. We discussed that he had taken the single most powerful action possible to decrease his risk of developing lung cancer when he quit smoking. I counseled [name] to remain smoke free, and to contact me if he ever had the desire to smoke again so that I can provide resources and tools to help support the effort to remain smoke free. We discussed the time and location of the scan, and that either [place] or I will call with the results within  24-48 hours of receiving them. [name] has my card and contact information in the event he needs to speak with me, in addition to a copy of the power point we reviewed as a resource. [name] verbalized understanding of all of the above and had no further questions upon leaving the office.    Magdalen Spatz, NP 02/01/16

## 2016-02-06 ENCOUNTER — Telehealth: Payer: Self-pay | Admitting: Acute Care

## 2016-02-06 ENCOUNTER — Encounter: Payer: Self-pay | Admitting: Internal Medicine

## 2016-02-06 NOTE — Telephone Encounter (Signed)
I called Dwayne Barnett to give him the results of his low-dose screening. There was no answer. I left a message requesting that he call the office for the results of his scan. I left contact information to facilitate his call.

## 2016-03-15 ENCOUNTER — Other Ambulatory Visit: Payer: Self-pay | Admitting: Acute Care

## 2016-03-15 ENCOUNTER — Telehealth: Payer: Self-pay | Admitting: Acute Care

## 2016-03-15 DIAGNOSIS — Z87891 Personal history of nicotine dependence: Secondary | ICD-10-CM

## 2016-03-15 NOTE — Telephone Encounter (Signed)
I called Dwayne Barnett with the results of his low-dose screening CT. I explained that his scan was read as a lung RADS 1, a negative scan. I told him that recommendation was for repeat annual scan in 12 months, which we will call and schedule in April 2018. He verbalized understanding of the results, and had no further questions. I told him I would fax the results to his primary care physician for completeness of his medical chart. He verbalized understanding.

## 2016-03-21 ENCOUNTER — Encounter: Payer: Self-pay | Admitting: Internal Medicine

## 2016-03-21 ENCOUNTER — Other Ambulatory Visit (INDEPENDENT_AMBULATORY_CARE_PROVIDER_SITE_OTHER): Payer: Medicare Other

## 2016-03-21 ENCOUNTER — Ambulatory Visit (INDEPENDENT_AMBULATORY_CARE_PROVIDER_SITE_OTHER): Payer: Medicare Other | Admitting: Internal Medicine

## 2016-03-21 VITALS — BP 116/58 | HR 68 | Ht 73.5 in | Wt 211.4 lb

## 2016-03-21 DIAGNOSIS — R0982 Postnasal drip: Secondary | ICD-10-CM

## 2016-03-21 DIAGNOSIS — R143 Flatulence: Secondary | ICD-10-CM

## 2016-03-21 DIAGNOSIS — IMO0001 Reserved for inherently not codable concepts without codable children: Secondary | ICD-10-CM

## 2016-03-21 DIAGNOSIS — R140 Abdominal distension (gaseous): Secondary | ICD-10-CM

## 2016-03-21 DIAGNOSIS — R197 Diarrhea, unspecified: Secondary | ICD-10-CM | POA: Diagnosis not present

## 2016-03-21 LAB — IGA: IgA: 86 mg/dL (ref 68–378)

## 2016-03-21 MED ORDER — RIFAXIMIN 550 MG PO TABS: 550.0000 mg | ORAL_TABLET | Freq: Three times a day (TID) | ORAL | Status: DC

## 2016-03-21 NOTE — Patient Instructions (Addendum)
You have been given a separate informational sheet regarding your tobacco use, the importance of quitting and local resources to help you quit.  We have sent the following medications to the pharmacy:  [place] physician has requested that you go to the basement for the following lab work before leaving today:  TTG, IGA  If you are age 73 or older, your body mass index should be between 23-30. Your Body mass index is 27.51 kg/(m^2). If this is out of the aforementioned range listed, please consider follow up with your Primary Care Provider.  If you are age 24 or younger, your body mass index should be between 19-25. Your Body mass index is 27.51 kg/(m^2). If this is out of the aformentioned range listed, please consider follow up with your Primary Care Provider.

## 2016-03-21 NOTE — Progress Notes (Signed)
HISTORY OF PRESENT ILLNESS:  Dwayne Barnett is a 75 y.o. male with hypertension, hyperlipidemia, anxiety, and long standing diabetes mellitus who is self-referred with chief complaints of extreme flatus, bloating, irregular bowel habits, and gagging with postnasal drip. He states that these had these problems for about 20 years after being diagnosed with diabetes mellitus. She tells me that he was diagnosed with gastroparesis in [place]. Review of the electronic medical record shows that the patient had an unremarkable upper endoscopy with biopsies May 2013 at [facility]. He also tells me that he underwent colonoscopy in [place] little over 10 years ago and at [facility] proximal me 5 years ago. He tells me that he had mild diverticulosis but no other problems. He tells me that he was told he did not require follow-up. Patient reports that he gets postnasal drip which results in gagging sensation and occasional vomiting. He does have a history of reflux for which she takes pantoprazole. This controls classic symptoms. No dysphagia. He also reports long-standing problems with flatus and bloating. He did try probiotic one point which seemed to help some. He also reports occasional loose stools with some urgency. He has been on metformin for sometime. No bleeding or weight loss. His diabetes has been under fair control with last hemoglobin A1c in January 8.2  REVIEW OF SYSTEMS:  All non-GI ROS negative except for sinus allergy trouble, excessive urination, urinary leakage  Past Medical History  Diagnosis Date  . Anxiety   . Diabetes mellitus without complication ([place])   . Depression   . Hyperlipidemia   . Hypertension   . AAA (abdominal aortic aneurysm) ([place])   . Syncope   . Hydrocele     Past Surgical History  Procedure Laterality Date  . Tonsilectomy, adenoidectomy, bilateral myringotomy and tubes Bilateral 1972  . Facial cosmetic surgery  2014    lower  . Blepharoplasty      Social  History Dwayne Barnett  reports that he has been smoking Cigarettes.  He has a 40 pack-year smoking history. He does not have any smokeless tobacco history on file. He reports that he does not drink alcohol or use illicit drugs.  family history includes Diabetes in his mother and sister; Heart disease in his father and mother; Hyperlipidemia in his father and mother; Pancreatic cancer in his cousin. There is no history of Colon cancer.  No Known Allergies     PHYSICAL EXAMINATION: Vital signs: BP 116/58 mmHg  Pulse 68  Ht 6' 1.5" (1.867 m)  Wt 211 lb 6.4 oz (95.89 kg)  BMI 27.51 kg/m2  Constitutional: generally well-appearing, no acute distress Psychiatric: alert and oriented x3, cooperative Eyes: extraocular movements intact, anicteric, conjunctiva pink Mouth: oral pharynx moist, no lesions Neck: suppleWithout thyromegaly Lymph: no lymphadenopathy Cardiovascular: heart regular rate and rhythm, no murmur Lungs: clear to auscultation bilaterally Abdomen: soft, nontender, nondistended, no obvious ascites, no peritoneal signs, normal bowel sounds, no organomegaly Rectal: Omitted Extremities: no clubbing cyanosis or lower extremity edema bilaterally Skin: no lesions on visible extremities Neuro: No focal deficits. Cranial nerves intact. Normal DTRs  ASSESSMENT:  #1. Chronic flatus. Possible bacterial overgrowth. No alarm features. Associated bloating #2. Intermittent loose stools as described. May be related to bacterial overgrowth. Possibly metformin. Has had colonoscopies #3. GERD. Classic symptoms controlled with PPI #4. Postnasal drip with gagging. Not A primary GI problem   PLAN:  #1. Screening for celiac disease with tissue transglutaminase antibody and serum IgA #2. Prescribed Xifaxan 550 mg 3 times a day  for 2 weeks for possible bacterial overgrowth #3. Importance of good diabetic control stressed to help GI motility issues #4. Reflux precautions #5. PPI to control GERD  symptoms. #6. Screening colonoscopy. To be up-to-date based on patient report. No routine follow-up planned #7. Ongoing general medical care with PCP area GI follow-up as needed

## 2016-03-22 ENCOUNTER — Telehealth: Payer: Self-pay | Admitting: Internal Medicine

## 2016-03-22 LAB — TISSUE TRANSGLUTAMINASE, IGA: Tissue Transglutaminase Ab, IgA: 1 U/mL (ref <4)

## 2016-03-22 NOTE — Telephone Encounter (Signed)
Noted  

## 2016-05-04 DIAGNOSIS — Z79899 Other long term (current) drug therapy: Secondary | ICD-10-CM | POA: Diagnosis not present

## 2016-05-04 DIAGNOSIS — I10 Essential (primary) hypertension: Secondary | ICD-10-CM | POA: Diagnosis not present

## 2016-05-04 DIAGNOSIS — N433 Hydrocele, unspecified: Secondary | ICD-10-CM | POA: Diagnosis not present

## 2016-05-04 DIAGNOSIS — R399 Unspecified symptoms and signs involving the genitourinary system: Secondary | ICD-10-CM | POA: Diagnosis not present

## 2016-05-04 DIAGNOSIS — E139 Other specified diabetes mellitus without complications: Secondary | ICD-10-CM | POA: Diagnosis not present

## 2016-05-04 DIAGNOSIS — F3342 Major depressive disorder, recurrent, in full remission: Secondary | ICD-10-CM | POA: Diagnosis not present

## 2016-05-04 DIAGNOSIS — E782 Mixed hyperlipidemia: Secondary | ICD-10-CM | POA: Diagnosis not present

## 2016-05-04 DIAGNOSIS — R972 Elevated prostate specific antigen [PSA]: Secondary | ICD-10-CM | POA: Diagnosis not present

## 2016-05-16 DIAGNOSIS — N433 Hydrocele, unspecified: Secondary | ICD-10-CM | POA: Diagnosis not present

## 2016-06-13 DIAGNOSIS — R399 Unspecified symptoms and signs involving the genitourinary system: Secondary | ICD-10-CM | POA: Diagnosis not present

## 2016-06-13 DIAGNOSIS — N433 Hydrocele, unspecified: Secondary | ICD-10-CM | POA: Diagnosis not present

## 2016-06-13 DIAGNOSIS — N401 Enlarged prostate with lower urinary tract symptoms: Secondary | ICD-10-CM | POA: Diagnosis not present

## 2016-08-04 DIAGNOSIS — E139 Other specified diabetes mellitus without complications: Secondary | ICD-10-CM | POA: Diagnosis not present

## 2016-08-04 DIAGNOSIS — Z23 Encounter for immunization: Secondary | ICD-10-CM | POA: Diagnosis not present

## 2016-08-04 DIAGNOSIS — R7989 Other specified abnormal findings of blood chemistry: Secondary | ICD-10-CM | POA: Diagnosis not present

## 2016-08-04 DIAGNOSIS — E782 Mixed hyperlipidemia: Secondary | ICD-10-CM | POA: Diagnosis not present

## 2016-08-04 DIAGNOSIS — I10 Essential (primary) hypertension: Secondary | ICD-10-CM | POA: Diagnosis not present

## 2016-08-15 DIAGNOSIS — E785 Hyperlipidemia, unspecified: Secondary | ICD-10-CM | POA: Diagnosis not present

## 2016-08-15 DIAGNOSIS — Z7982 Long term (current) use of aspirin: Secondary | ICD-10-CM | POA: Diagnosis not present

## 2016-08-15 DIAGNOSIS — E119 Type 2 diabetes mellitus without complications: Secondary | ICD-10-CM | POA: Diagnosis not present

## 2016-08-15 DIAGNOSIS — Z79899 Other long term (current) drug therapy: Secondary | ICD-10-CM | POA: Diagnosis not present

## 2016-08-15 DIAGNOSIS — N433 Hydrocele, unspecified: Secondary | ICD-10-CM | POA: Diagnosis not present

## 2016-08-15 DIAGNOSIS — Z5181 Encounter for therapeutic drug level monitoring: Secondary | ICD-10-CM | POA: Diagnosis not present

## 2016-08-15 DIAGNOSIS — I10 Essential (primary) hypertension: Secondary | ICD-10-CM | POA: Diagnosis not present

## 2016-12-20 DIAGNOSIS — E782 Mixed hyperlipidemia: Secondary | ICD-10-CM | POA: Diagnosis not present

## 2016-12-20 DIAGNOSIS — I714 Abdominal aortic aneurysm, without rupture: Secondary | ICD-10-CM | POA: Diagnosis not present

## 2016-12-20 DIAGNOSIS — E139 Other specified diabetes mellitus without complications: Secondary | ICD-10-CM | POA: Diagnosis not present

## 2016-12-20 DIAGNOSIS — I10 Essential (primary) hypertension: Secondary | ICD-10-CM | POA: Diagnosis not present

## 2016-12-20 DIAGNOSIS — E119 Type 2 diabetes mellitus without complications: Secondary | ICD-10-CM | POA: Diagnosis not present

## 2017-02-01 ENCOUNTER — Inpatient Hospital Stay: Admission: RE | Admit: 2017-02-01 | Payer: Medicare Other | Source: Ambulatory Visit

## 2017-02-01 ENCOUNTER — Ambulatory Visit (INDEPENDENT_AMBULATORY_CARE_PROVIDER_SITE_OTHER)
Admission: RE | Admit: 2017-02-01 | Discharge: 2017-02-01 | Disposition: A | Discharge status: Home / Self Care | Payer: Medicare Other | Source: Ambulatory Visit | Attending: Acute Care | Admitting: Acute Care

## 2017-02-01 DIAGNOSIS — Z87891 Personal history of nicotine dependence: Secondary | ICD-10-CM | POA: Diagnosis not present

## 2017-02-14 ENCOUNTER — Telehealth: Payer: Self-pay | Admitting: Acute Care

## 2017-02-14 DIAGNOSIS — Z87891 Personal history of nicotine dependence: Secondary | ICD-10-CM

## 2017-02-14 NOTE — Telephone Encounter (Signed)
I called Dwayne Barnett to give him his low dose screening scan results. There was no answer. I have left a message with the office contact information, and have requested he call the office for results.I will await his return call.

## 2017-02-16 ENCOUNTER — Telehealth: Payer: Self-pay | Admitting: Acute Care

## 2017-02-16 LAB — HM DIABETES EYE EXAM

## 2017-02-17 NOTE — Telephone Encounter (Signed)
I have returned [name] call. I explained to him that his scan was read as a Lung RADS 4 A : suspicious findings, either short term follow up in 3 months or alternatively  PET Scan evaluation may be considered when there is a solid component of  8 mm or larger. Explained that we have ordered a follow-up scan for the beginning of July 2018. I also explained that I would fax a copy of the results of this scan to his primary care physician. I explained that per the radiology report the area in question favors scarring, or motion artifact, but that we would repeat the scan in 3 months to be careful and sure. He verbalized understanding of the above and had no further questions at completion of the call.

## 2017-02-17 NOTE — Telephone Encounter (Signed)
Pt is returning SG's call in regards to results.  SG please advise. Thanks.

## 2017-03-20 DIAGNOSIS — N183 Chronic kidney disease, stage 3 (moderate): Secondary | ICD-10-CM | POA: Diagnosis not present

## 2017-03-20 DIAGNOSIS — E782 Mixed hyperlipidemia: Secondary | ICD-10-CM | POA: Diagnosis not present

## 2017-03-20 DIAGNOSIS — E119 Type 2 diabetes mellitus without complications: Secondary | ICD-10-CM | POA: Diagnosis not present

## 2017-03-20 DIAGNOSIS — I10 Essential (primary) hypertension: Secondary | ICD-10-CM | POA: Diagnosis not present

## 2017-04-03 DIAGNOSIS — J310 Chronic rhinitis: Secondary | ICD-10-CM | POA: Diagnosis not present

## 2017-04-14 DIAGNOSIS — J329 Chronic sinusitis, unspecified: Secondary | ICD-10-CM | POA: Diagnosis not present

## 2017-04-14 DIAGNOSIS — R112 Nausea with vomiting, unspecified: Secondary | ICD-10-CM | POA: Diagnosis not present

## 2017-05-04 ENCOUNTER — Ambulatory Visit (INDEPENDENT_AMBULATORY_CARE_PROVIDER_SITE_OTHER)
Admission: RE | Admit: 2017-05-04 | Discharge: 2017-05-04 | Disposition: A | Discharge status: Home / Self Care | Payer: Medicare Other | Source: Ambulatory Visit | Attending: Acute Care | Admitting: Acute Care

## 2017-05-04 DIAGNOSIS — Z87891 Personal history of nicotine dependence: Secondary | ICD-10-CM

## 2017-05-04 DIAGNOSIS — R911 Solitary pulmonary nodule: Secondary | ICD-10-CM

## 2017-05-05 ENCOUNTER — Other Ambulatory Visit: Payer: Self-pay | Admitting: Acute Care

## 2017-05-05 DIAGNOSIS — F17210 Nicotine dependence, cigarettes, uncomplicated: Secondary | ICD-10-CM

## 2017-05-08 ENCOUNTER — Ambulatory Visit (INDEPENDENT_AMBULATORY_CARE_PROVIDER_SITE_OTHER): Payer: Medicare Other | Admitting: Internal Medicine

## 2017-05-08 DIAGNOSIS — F17210 Nicotine dependence, cigarettes, uncomplicated: Secondary | ICD-10-CM

## 2017-05-08 LAB — PULMONARY FUNCTION TEST
DL/VA % PRED: 77 %
DL/VA: 3.7 ml/min/mmHg/L
DLCO unc % pred: 60 %
DLCO unc: 21.74 ml/min/mmHg
FEF 25-75 POST: 3.89 L/s
FEF 25-75 Pre: 3.52 L/sec
FEF2575-%CHANGE-POST: 10 %
FEF2575-%PRED-POST: 152 %
FEF2575-%PRED-PRE: 137 %
FEV1-%Change-Post: 2 %
FEV1-%PRED-POST: 93 %
FEV1-%Pred-Pre: 90 %
FEV1-PRE: 3.15 L
FEV1-Post: 3.24 L
FEV1FVC-%CHANGE-POST: 4 %
FEV1FVC-%Pred-Pre: 113 %
FEV6-%CHANGE-POST: -1 %
FEV6-%PRED-PRE: 84 %
FEV6-%Pred-Post: 83 %
FEV6-Post: 3.73 L
FEV6-Pre: 3.8 L
FEV6FVC-%Change-Post: 0 %
FEV6FVC-%Pred-Post: 106 %
FEV6FVC-%Pred-Pre: 106 %
FVC-%CHANGE-POST: -1 %
FVC-%PRED-POST: 78 %
FVC-%PRED-PRE: 80 %
FVC-POST: 3.74 L
FVC-PRE: 3.81 L
POST FEV1/FVC RATIO: 87 %
PRE FEV1/FVC RATIO: 83 %
Post FEV6/FVC ratio: 100 %
Pre FEV6/FVC Ratio: 100 %
RV % pred: 91 %
RV: 2.45 L
TLC % pred: 82 %
TLC: 6.27 L

## 2017-05-08 NOTE — Progress Notes (Signed)
PFT performed today. 

## 2017-05-18 IMAGING — CT CT CHEST LUNG CANCER SCREENING LOW DOSE W/O CM
2 of 5 series · 15 of 40 positions shown, 18 images · non-contrast
Comparison: None.

CLINICAL DATA: Forty pack-year history. Former smoker.
Asymptomatic.

EXAM:
CT CHEST WITHOUT CONTRAST LOW-DOSE FOR LUNG CANCER SCREENING
TECHNIQUE: Multidetector CT imaging of the chest was performed following the
standard protocol without IV contrast.

[Series 3: lung thins 1.0 · axial · 0.71mm/px · z∈[-210,+91]mm · 12 of 335 slices shown, 15 images]
[im 17/335  mediastinal]
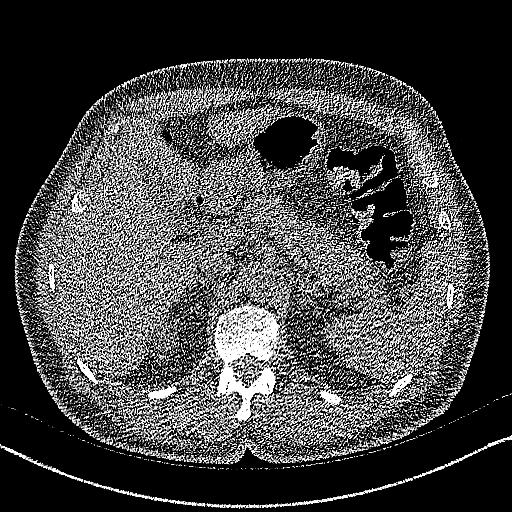
[im 17/335  lung]
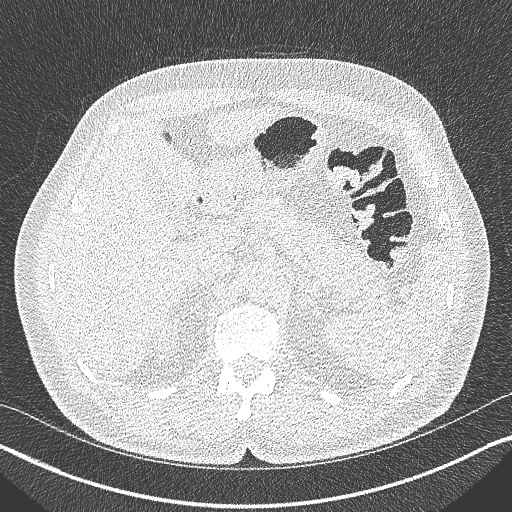
[im 51/335  lung]
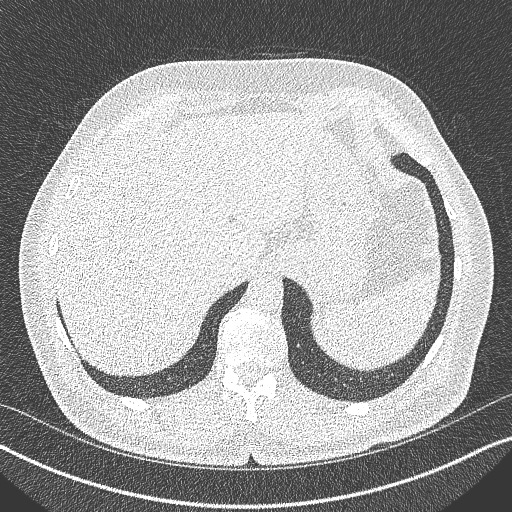
[im 67/335  lung]
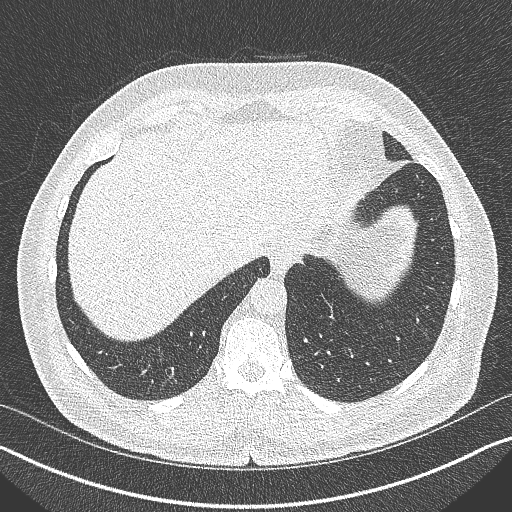
[im 101/335  lung]
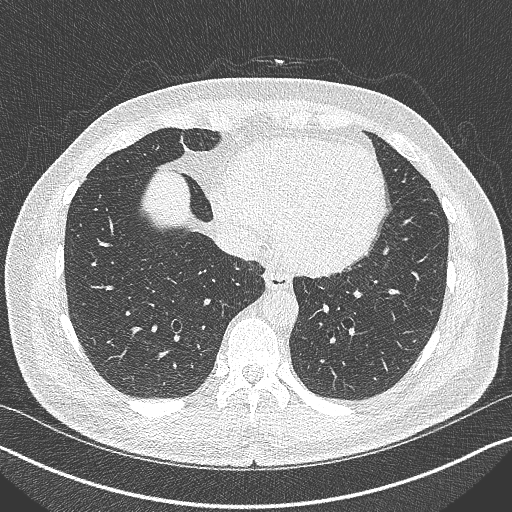
[im 134/335  mediastinal]
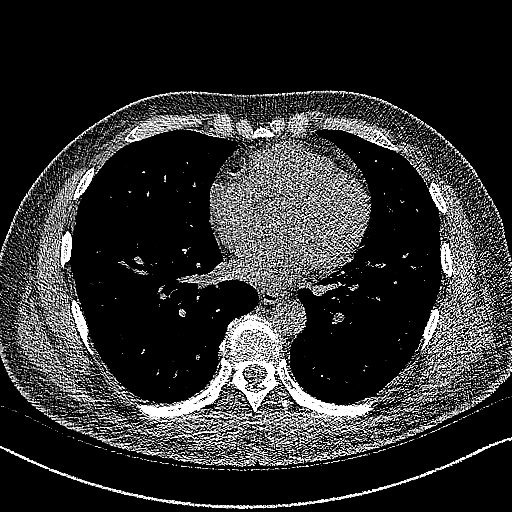
[im 134/335  lung]
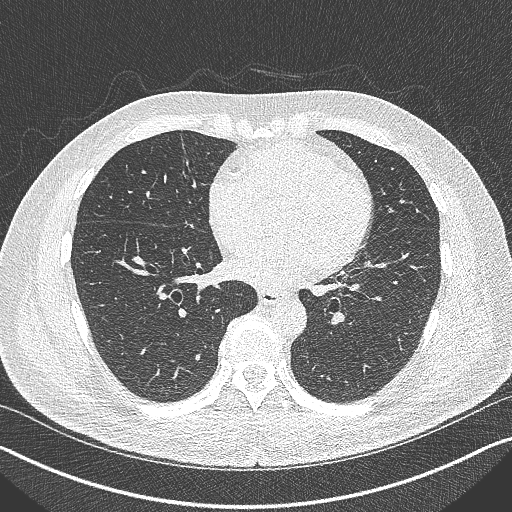
[im 151/335  lung]
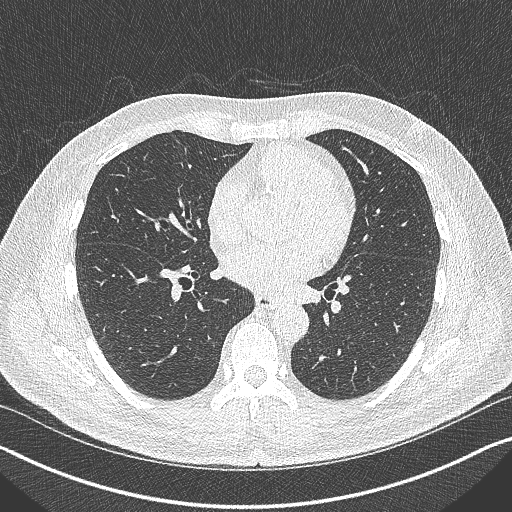
[im 184/335  lung]
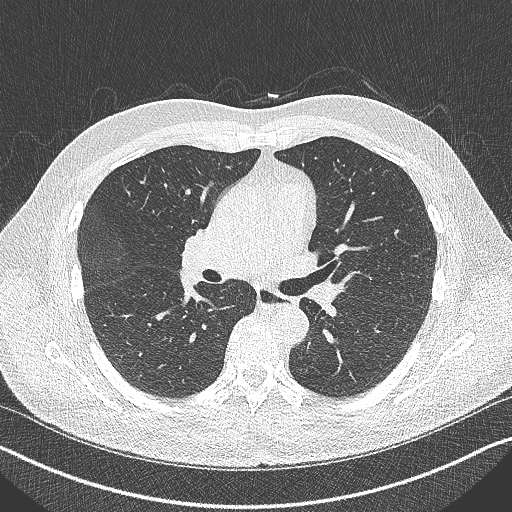
[im 201/335  lung]
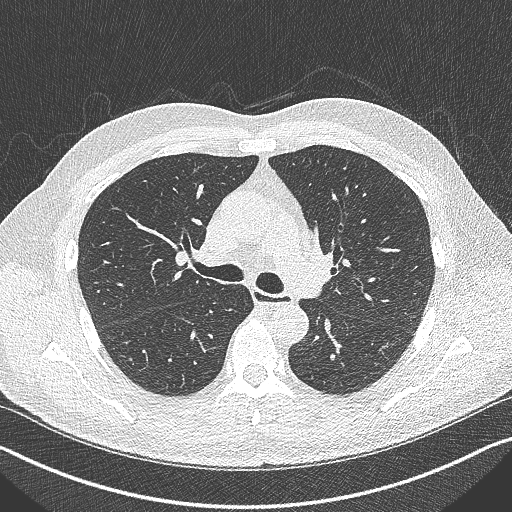
[im 234/335  mediastinal]
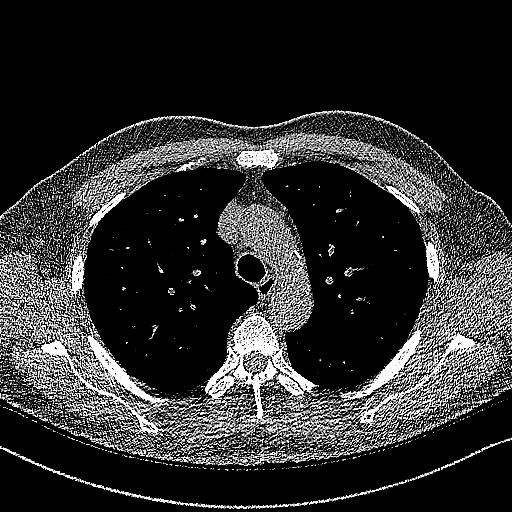
[im 234/335  lung]
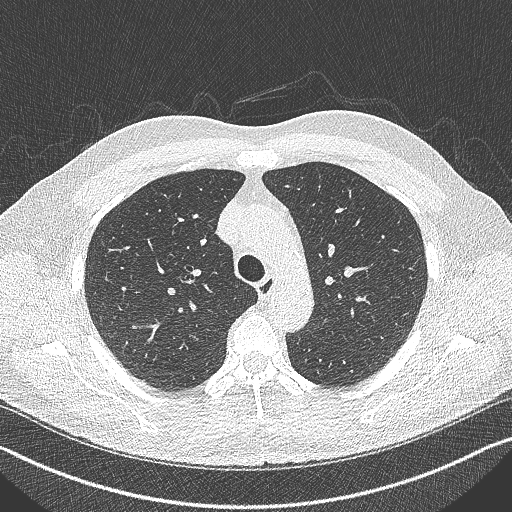
[im 268/335  lung]
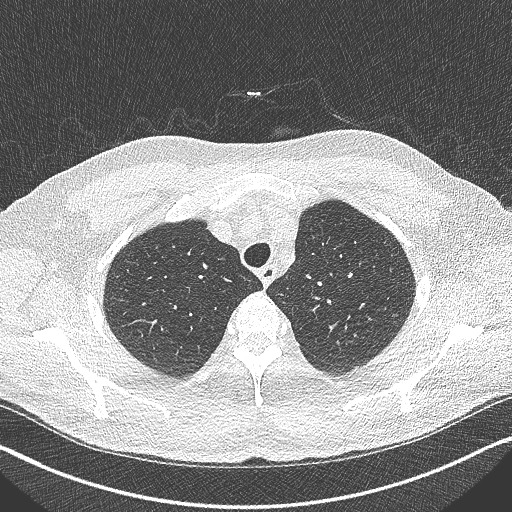
[im 284/335  lung]
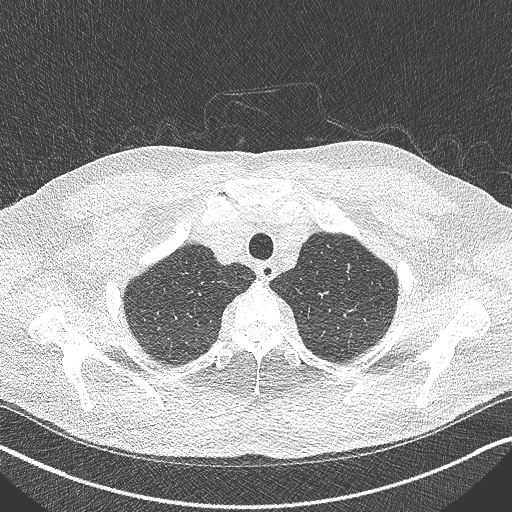
[im 318/335  lung]
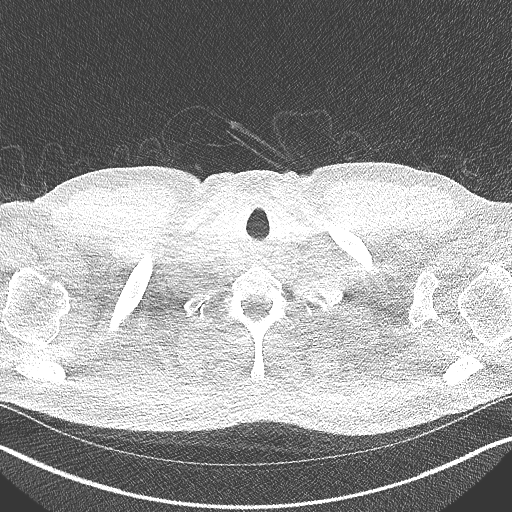

[Series 5: coronal · coronal · 0.59mm/px · 3 of 131 slices shown]
[im 27/131  lung]
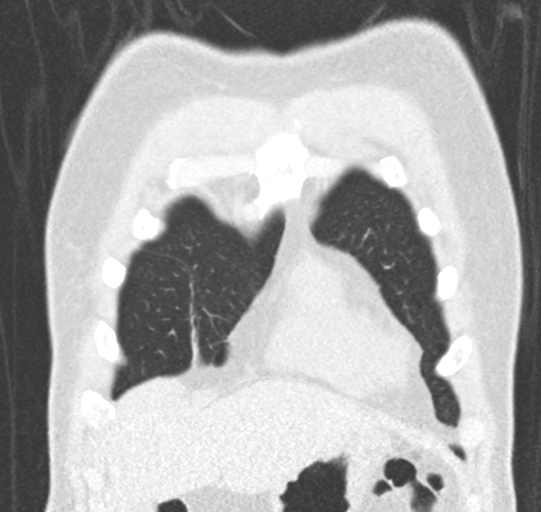
[im 53/131  lung]
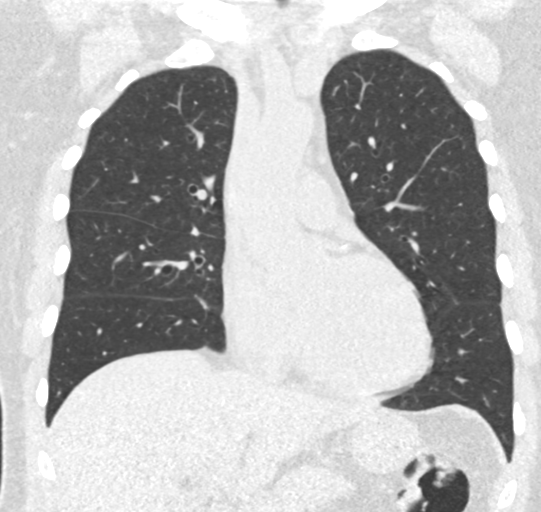
[im 79/131  lung]
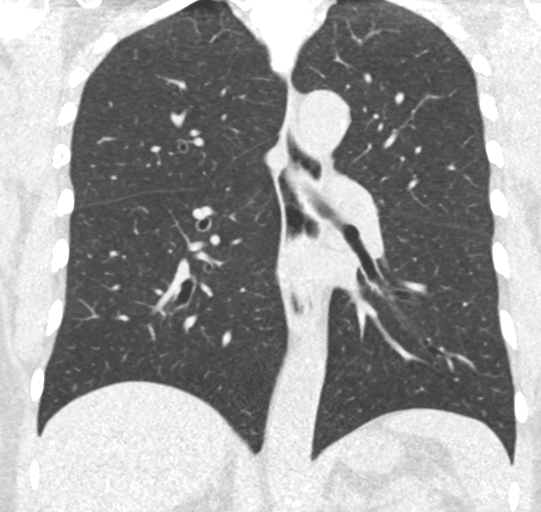

[15 of 40 positions shown; findings below may reference images not displayed]

FINDINGS: Mediastinum/Nodes: Normal heart size. No pericardial effusion.
Calcification within the aorta noted. There are also LAD, RCA and
left circumflex coronary artery calcifications. The trachea is
patent and midline. Normal appearance of the esophagus. No enlarged
mediastinal or hilar lymph nodes. No axillary or supraclavicular
adenopathy.

Lungs/Pleura: No pleural fluid. No suspicious pulmonary nodules
identified.

Upper abdomen: Normal appearance of the adrenal glands. The
visualized portions of the liver and spleen appear normal. The
adrenal glands are unremarkable. The visualized portions of the
pancreas are normal.

Musculoskeletal: Spondylosis noted within the thoracic spine. No
aggressive lytic or sclerotic bone lesions.
IMPRESSION: 1. Lung-RADS Category 1, negative. Continue annual screening with
low-dose chest CT without contrast in 12 months.
2. Aortic atherosclerosis and coronary artery calcification.

## 2017-05-19 ENCOUNTER — Encounter (HOSPITAL_COMMUNITY)
Admission: RE | Admit: 2017-05-19 | Discharge: 2017-05-19 | Disposition: A | Discharge status: Home / Self Care | Payer: Medicare Other | Source: Ambulatory Visit | Attending: Acute Care | Admitting: Acute Care

## 2017-05-19 DIAGNOSIS — F17210 Nicotine dependence, cigarettes, uncomplicated: Secondary | ICD-10-CM | POA: Diagnosis present

## 2017-05-19 DIAGNOSIS — R911 Solitary pulmonary nodule: Secondary | ICD-10-CM | POA: Diagnosis not present

## 2017-05-19 LAB — GLUCOSE, CAPILLARY: GLUCOSE-CAPILLARY: 141 mg/dL — AB (ref 65–99)

## 2017-05-19 MED ORDER — FLUDEOXYGLUCOSE F - 18 (FDG) INJECTION
10.7000 | Freq: Once | INTRAVENOUS | Status: AC | PRN
  Administered 2017-05-19: 10.7 via INTRAVENOUS

## 2017-05-26 ENCOUNTER — Telehealth: Payer: Self-pay | Admitting: Acute Care

## 2017-05-26 NOTE — Telephone Encounter (Signed)
I attempted to call [name] with the results of his PET scan. There was no answer. I have left our contact number and requested that he call the office for the results of his scan. If I do not hear back from him I will attempt to call again on Monday.

## 2017-05-29 ENCOUNTER — Encounter: Payer: Self-pay | Admitting: Internal Medicine

## 2017-05-29 ENCOUNTER — Telehealth: Payer: Self-pay | Admitting: Acute Care

## 2017-05-29 DIAGNOSIS — F17210 Nicotine dependence, cigarettes, uncomplicated: Secondary | ICD-10-CM

## 2017-05-29 NOTE — Progress Notes (Unsigned)
Results entered and sent to scan  

## 2017-05-29 NOTE — Telephone Encounter (Signed)
I have called Mr. Radu with the results of his PET scan. I explained that his PET scan indicated that the left lower lobe nodule that we had been concerned about did show a maximum SUV of 3.0. Although this is nonspecific and could certainly represent a low-grade malignancy. I explained that it is appropriate for [place] to go ahead and refer him to the thoracic surgeons for evaluation. I explained that he should get a call to schedule an appointment. He has verbalized understanding and agrees with the above plan. I will place a referral to  [facility] to evaluate left lower lobe nodule. Patient verbalized understanding of the above and had no further questions at completion of the call. Denise please fax results of PET scan to patient's PCP. I will place referral to [facility]. Thanks so much.

## 2017-06-01 ENCOUNTER — Encounter: Payer: Self-pay | Admitting: Internal Medicine

## 2017-06-01 NOTE — Telephone Encounter (Signed)
PET scan results faxed to PCP.

## 2017-06-01 NOTE — Progress Notes (Unsigned)
Results entered and sent to scan  

## 2017-06-06 ENCOUNTER — Encounter: Payer: Self-pay | Admitting: *Deleted

## 2017-06-06 ENCOUNTER — Encounter: Payer: Self-pay | Admitting: Thoracic Surgery (Cardiothoracic Vascular Surgery)

## 2017-06-06 ENCOUNTER — Institutional Professional Consult (permissible substitution) (INDEPENDENT_AMBULATORY_CARE_PROVIDER_SITE_OTHER): Payer: Medicare Other | Admitting: Thoracic Surgery (Cardiothoracic Vascular Surgery)

## 2017-06-06 VITALS — BP 135/73 | HR 100 | Resp 16 | Ht 73.5 in | Wt 215.0 lb

## 2017-06-06 DIAGNOSIS — D381 Neoplasm of uncertain behavior of trachea, bronchus and lung: Secondary | ICD-10-CM | POA: Diagnosis not present

## 2017-06-06 NOTE — Progress Notes (Signed)
PCP is Dwayne Barnett, Dwayne Pierini, MD Referring Provider is Dwayne Spatz, NP  Chief Complaint  Patient presents with  . Lung Lesion    LLLobe.Marland KitchenMarland KitchenLUNG CA SCREENING CT 01/30/17, PET 05/19/17    HPI: [name] is a 76 year old gentleman sent for consultation regarding a left lower lobe lung nodule.  [name] is a 76 year old gentleman with a history of an abdominal aortic aneurysm, type 2 diabetes without complication, hyperlipidemia, hypertension, syncope, anxiety, depression, and tobacco abuse. He smoked just less than a pack of cigarettes a day for 50 years. He quit 2 months ago. He entered the low-dose CT screening program in 2017. A CT at that time showed no suspicious pulmonary lesions. A CT in April of this year showed a 1.2 x 2 cm nodule in the basilar portion of the left lower lobe adjacent to the heart. A PET CT was done in July which showed the nodule is moderately hypermetabolic with an SUV of 3.  He has been feeling well. He denies any chest pain, pressure, or tightness. He get short of breath only with very heavy exertion. He denies any cough, hemoptysis, or wheezing. No unusual headaches or visual changes. No change in appetite or weight loss. Zubrod Score: At the time of surgery this patient's most appropriate activity status/level should be described as: '[x]'     0    Normal activity, no symptoms '[]'     1    Restricted in physical strenuous activity but ambulatory, able to do out light work '[]'     2    Ambulatory and capable of self care, unable to do work activities, up and about >50 % of waking hours                              '[]'     3    Only limited self care, in bed greater than 50% of waking hours '[]'     4    Completely disabled, no self care, confined to bed or chair '[]'     5    Moribund   Past Medical History:  Diagnosis Date  . AAA (abdominal aortic aneurysm) ([place])   . Anxiety   . Depression   . Diabetes mellitus without complication ([place])   . Hydrocele   .  Hyperlipidemia   . Hypertension   . Syncope     Past Surgical History:  Procedure Laterality Date  . BLEPHAROPLASTY    . FACIAL COSMETIC SURGERY  2014   lower  . TONSILECTOMY, ADENOIDECTOMY, BILATERAL MYRINGOTOMY AND TUBES Bilateral 1972    Family History  Problem Relation Age of Onset  . Heart disease Mother   . Hyperlipidemia Mother   . Diabetes Mother   . Heart disease Father   . Hyperlipidemia Father   . Diabetes Sister   . Pancreatic cancer Cousin   . Colon cancer Neg Hx     Social History Social History  Substance Use Topics  . Smoking status: Current Some Day Smoker    Packs/day: 0.80    Years: 50.00    Types: Cigarettes    Last attempt to quit: 12/28/2015  . Smokeless tobacco: Never Used     Comment: has smoked intermittently for quite sometime  . Alcohol use No    Current Outpatient Prescriptions  Medication Sig Dispense Refill  . amLODipine (NORVASC) 5 MG tablet Take 5 mg by mouth daily.    Marland Kitchen aspirin 325 MG EC tablet  Take 325 mg by mouth daily.    . Blood Glucose Monitoring Suppl (BLOOD GLUCOSE MONITOR SYSTEM) w/Device KIT by Does not apply route.    Marland Kitchen buPROPion (WELLBUTRIN) 75 MG tablet Take 75 mg by mouth daily.     . Coenzyme Q10 (COQ10 PO) Take by mouth daily.    . Cyanocobalamin (B-12) 2500 MCG SUBL Place 1 tablet under the tongue daily.    . DHA-EPA-Vit B6-B12-Folic Acid (CARDIOVID PLUS) CAPS Take 3 capsules by mouth daily.     . EZ SMART BLOOD GLUCOSE LANCETS MISC by Does not apply route.    Marland Kitchen glucose blood test strip 1 each by Other route as needed for other. Use as instructed    . lisinopril (PRINIVIL,ZESTRIL) 40 MG tablet Take 1 tablet (40 mg total) by mouth daily. 90 tablet 3  . metFORMIN (GLUCOPHAGE) 1000 MG tablet Take 1 tablet (1,000 mg total) by mouth 2 (two) times daily with a meal. 180 tablet 3  . niacin 100 MG tablet Take 100 mg by mouth at bedtime.    . pantoprazole (PROTONIX) 40 MG tablet Take 40 mg by mouth daily.    . Probiotic  Product (PROBIOTIC DAILY PO) Take by mouth daily.    . rosuvastatin (CRESTOR) 40 MG tablet Take 40 mg by mouth daily.    . sitaGLIPtin (JANUVIA) 100 MG tablet Take 100 mg by mouth daily.    . tadalafil (CIALIS) 20 MG tablet Take 20 mg by mouth daily as needed for erectile dysfunction.     No current facility-administered medications for this visit.     No Known Allergies  Review of Systems  Constitutional: Negative for activity change, appetite change and unexpected weight change.  HENT: Negative for trouble swallowing and voice change.   Eyes: Negative for visual disturbance.  Respiratory: Negative for cough, shortness of breath and wheezing.   Cardiovascular: Negative for chest pain and leg swelling.  Gastrointestinal: Negative for abdominal pain and blood in stool.  Genitourinary: Negative for difficulty urinating and hematuria.  Musculoskeletal: Negative for arthralgias and myalgias.  Neurological: Positive for syncope. Negative for speech difficulty, weakness and headaches.  Hematological: Negative for adenopathy. Does not bruise/bleed easily.  Psychiatric/Behavioral: Positive for dysphoric mood. The patient is nervous/anxious.     BP 135/73   Pulse 100   Resp 16   Ht 6' 1.5" (1.867 m)   Wt 215 lb (97.5 kg)   BMI 27.98 kg/m  Physical Exam  Constitutional: He is oriented to person, place, and time. He appears well-developed and well-nourished. No distress.  HENT:  Head: Normocephalic and atraumatic.  Mouth/Throat: No oropharyngeal exudate.  Eyes: Conjunctivae and EOM are normal. No scleral icterus.  Neck: Neck supple. No thyromegaly present.  Cardiovascular: Normal rate, regular rhythm, normal heart sounds and intact distal pulses.  Exam reveals no gallop and no friction rub.   No murmur heard. Pulmonary/Chest: Effort normal and breath sounds normal. No respiratory distress. He has no wheezes. He has no rales.  Abdominal: Soft. He exhibits no distension. There is no  tenderness.  Musculoskeletal: Normal range of motion. He exhibits no edema or deformity.  Lymphadenopathy:    He has no cervical adenopathy.  Neurological: He is alert and oriented to person, place, and time. No cranial nerve deficit.  No focal motor deficit  Skin: Skin is warm and dry.  Psychiatric: He has a normal mood and affect.  Vitals reviewed.    Diagnostic Tests: CT CHEST WITHOUT CONTRAST LOW-DOSE FOR LUNG CANCER SCREENING  TECHNIQUE: Multidetector CT imaging of the chest was performed following the standard protocol without IV contrast.  COMPARISON:  Low-dose lung cancer screening CT chest dated 02/01/2016  FINDINGS: Cardiovascular: Heart is normal in size.  No pericardial effusion.  Three vessel coronary atherosclerosis.  Mild atherosclerotic calcifications aortic arch.  Mediastinum/Nodes: No suspicious mediastinal lymphadenopathy.  Visualized thyroid is unremarkable.  Lungs/Pleura: Mild linear scarring in the medial right middle lobe. Mild scarring/ atelectasis at the left lung base.  No focal consolidation.  Nodular opacity in the medial left lower lobe (series 4/image 206), measuring 20.1 mm (volumetric mean), possibly mildly increased in size when compared to the prior, although this may be exacerbated by motion artifact. Appearance favors nodular scarring but is poorly evaluated on above studies due to motion.  Technically, this reflects a Lung rads 4B lesion, but given the imaging characteristics, it will be manually downgraded to 4A.  No pleural effusion or pneumothorax.  Upper Abdomen: Visualized upper abdomen is unremarkable.  Musculoskeletal: Mild degenerative changes of the visualized thoracolumbar spine.  IMPRESSION: 20.1 mm nodular opacity in the medial left lower lobe, poorly evaluated due to motion, but possibly mildly increased.  Lung-RADS Category 4A, suspicious. Follow up low-dose chest CT without contrast in 3  months (please use the following order, "CT CHEST LCS NODULE FOLLOW-UP W/O CM") is recommended. Alternatively, PET may be considered.   Electronically Signed   By: Julian Hy M.D.   On: 02/01/2017 16:22 NUCLEAR MEDICINE PET SKULL BASE TO THIGH  TECHNIQUE: 10.7 mCi F-18 FDG was injected intravenously. Full-ring PET imaging was performed from the skull base to thigh after the radiotracer. CT data was obtained and used for attenuation correction and anatomic localization.  FASTING BLOOD GLUCOSE:  Value: 141 mg/dl  COMPARISON:  Chest CT dated 05/04/2017  FINDINGS: NECK  No hypermetabolic lymph nodes in the neck.  CHEST  The medial left lower lobe nodule of concern measures approximately 1.3 by 2.0 cm on image 53/7, maximum SUV 3.0.  No pathologically enlarged or hypermetabolic adenopathy in the chest.  Coronary, aortic arch, and branch vessel atherosclerotic vascular disease. Mild cardiomegaly.  ABDOMEN/PELVIS  No abnormal hypermetabolic activity within the liver, pancreas, adrenal glands, or spleen. No hypermetabolic lymph nodes in the abdomen or pelvis.  Accentuated activity in the sigmoid colon and rectum extends over a long segment and is likely physiologic. Multifocal physiologic activity in the rest of the colon without CT correlate.  Aortoiliac atherosclerotic vascular disease. Infrarenal abdominal aortic aneurysm, 4.1 by 2.9 cm. Ectatic right common iliac artery.  Bilateral hydroceles, right greater than left.  SKELETON  No focal hypermetabolic activity to suggest skeletal metastasis.  Heterotopic calcification in the left distal iliopsoas. Small umbilical hernia contains adipose tissue. Bridging spurring of the left sacroiliac joint anteriorly.  IMPRESSION: 1. The medial left lower lobe nodule of concern has a maximum SUV of 3.0. Although nonspecific, this could certainly represent a low-grade malignancy. 2. Aortic  Atherosclerosis (ICD10-I70.0). Coronary atherosclerosis with mild cardiomegaly. 3. Multifocal accentuated activity in the colon, without CT correlate. This is likely physiologic. Correlation with the patient's colon cancer screening history is recommended. If screening is not up-to-date, appropriate screening should be considered. 4. Bilateral scrotal hydroceles, larger on the right than the left. 5. **An incidental finding of potential clinical significance has been found. Infrarenal abdominal aortic aneurysm measuring 4.1 by 2.9 cm. Recommend followup by ultrasound in 1 year. This recommendation follows ACR consensus guidelines: White Paper of the ACR Incidental Findings Committee II on Vascular  Findings. J Am Coll Radiol 2013; 10:789-794.**   Electronically Signed   By: Van Clines M.D.   On: 05/19/2017 15:08  Pulmonary function testing FVC 3.81 (80%) FEV1 3.15 (90%) TLC 6.27 (82%) DLCO 21.74 (60%)  I personally reviewed the CT and PET CT images and concur with the findings noted above  Impression: [name] is a 76 year old gentleman with a history of tobacco abuse although he recently quit 2 months ago. Low-dose CT screening shows a suspicious lesion in the basilar segment of the left lower lobe in close proximity to the heart. This lesion is moderately hypermetabolic by PET CT.  Given his age and smoking history this has to be considered a new primary bronchogenic carcinoma less can be proven otherwise. Other possibilities in the differential include infectious or inflammatory nodules.  The nodule is in the very close proximity to the heart. I do not think it would be wise to attempt to biopsy that either percutaneously or bronchoscopically do the risk. My recommendation to Mr. and Mrs. Dwayne Barnett was that we proceed with left VATS for wedge resection and then a frozen section was positive for cancer we would proceed with lobectomy for definitive treatment at the same  setting.  I described the proposed operation to them in detail. They understand the need for general anesthesia, incisions be used, the use of the drainage tube postoperatively, the expected hospital stay, and the overall recovery. I informed him of the indications, risks, benefits, and alternatives. They understand the risks include, but are not limited to death, MI, VT, PE, stroke, bleeding, possible need for transfusion, infection, prolonged air leak, cardiac arrhythmias, as well as the possibility of other unforeseeable complications.  He wishes to think over these issues before making a decision. He has our number and can call to schedule if he would like area I will set him up with an appointment to see him back in 3 weeks to further discuss these issues if he has not been able to decide.  Plan: Return in 3 weeks. Patient may call to schedule surgery sooner if he wishes.  Melrose Nakayama, MD [facility] [phone]

## 2017-06-13 ENCOUNTER — Other Ambulatory Visit: Payer: Self-pay | Admitting: *Deleted

## 2017-06-13 DIAGNOSIS — R911 Solitary pulmonary nodule: Secondary | ICD-10-CM

## 2017-06-20 DIAGNOSIS — I10 Essential (primary) hypertension: Secondary | ICD-10-CM | POA: Diagnosis not present

## 2017-06-20 DIAGNOSIS — Z7982 Long term (current) use of aspirin: Secondary | ICD-10-CM | POA: Diagnosis not present

## 2017-06-20 DIAGNOSIS — Z7984 Long term (current) use of oral hypoglycemic drugs: Secondary | ICD-10-CM | POA: Diagnosis not present

## 2017-06-20 DIAGNOSIS — R911 Solitary pulmonary nodule: Secondary | ICD-10-CM | POA: Diagnosis not present

## 2017-06-20 DIAGNOSIS — E785 Hyperlipidemia, unspecified: Secondary | ICD-10-CM | POA: Diagnosis not present

## 2017-06-20 DIAGNOSIS — E119 Type 2 diabetes mellitus without complications: Secondary | ICD-10-CM | POA: Diagnosis not present

## 2017-06-20 DIAGNOSIS — Z79899 Other long term (current) drug therapy: Secondary | ICD-10-CM | POA: Diagnosis not present

## 2017-06-22 ENCOUNTER — Other Ambulatory Visit: Payer: Self-pay | Admitting: *Deleted

## 2017-06-24 ENCOUNTER — Encounter: Payer: Self-pay | Admitting: Thoracic Surgery (Cardiothoracic Vascular Surgery)

## 2017-06-27 ENCOUNTER — Encounter: Payer: Medicare Other | Admitting: Thoracic Surgery (Cardiothoracic Vascular Surgery)

## 2017-06-27 DIAGNOSIS — R918 Other nonspecific abnormal finding of lung field: Secondary | ICD-10-CM | POA: Diagnosis not present

## 2017-06-27 DIAGNOSIS — Z01818 Encounter for other preprocedural examination: Secondary | ICD-10-CM | POA: Diagnosis not present

## 2017-07-04 ENCOUNTER — Other Ambulatory Visit (HOSPITAL_COMMUNITY): Payer: Medicare Other

## 2017-07-05 DIAGNOSIS — J90 Pleural effusion, not elsewhere classified: Secondary | ICD-10-CM | POA: Diagnosis not present

## 2017-07-05 DIAGNOSIS — E785 Hyperlipidemia, unspecified: Secondary | ICD-10-CM | POA: Diagnosis not present

## 2017-07-05 DIAGNOSIS — I493 Ventricular premature depolarization: Secondary | ICD-10-CM | POA: Diagnosis not present

## 2017-07-05 DIAGNOSIS — C3492 Malignant neoplasm of unspecified part of left bronchus or lung: Secondary | ICD-10-CM | POA: Diagnosis not present

## 2017-07-05 DIAGNOSIS — R911 Solitary pulmonary nodule: Secondary | ICD-10-CM | POA: Diagnosis not present

## 2017-07-05 DIAGNOSIS — Z720 Tobacco use: Secondary | ICD-10-CM | POA: Diagnosis not present

## 2017-07-05 DIAGNOSIS — R918 Other nonspecific abnormal finding of lung field: Secondary | ICD-10-CM | POA: Diagnosis not present

## 2017-07-05 DIAGNOSIS — Z87891 Personal history of nicotine dependence: Secondary | ICD-10-CM | POA: Diagnosis not present

## 2017-07-05 DIAGNOSIS — E119 Type 2 diabetes mellitus without complications: Secondary | ICD-10-CM | POA: Diagnosis not present

## 2017-07-05 DIAGNOSIS — I714 Abdominal aortic aneurysm, without rupture: Secondary | ICD-10-CM | POA: Diagnosis not present

## 2017-07-05 DIAGNOSIS — I9581 Postprocedural hypotension: Secondary | ICD-10-CM | POA: Diagnosis not present

## 2017-07-05 DIAGNOSIS — Z7984 Long term (current) use of oral hypoglycemic drugs: Secondary | ICD-10-CM | POA: Diagnosis not present

## 2017-07-05 DIAGNOSIS — J9811 Atelectasis: Secondary | ICD-10-CM | POA: Diagnosis not present

## 2017-07-05 DIAGNOSIS — Z4682 Encounter for fitting and adjustment of non-vascular catheter: Secondary | ICD-10-CM | POA: Diagnosis not present

## 2017-07-05 DIAGNOSIS — I10 Essential (primary) hypertension: Secondary | ICD-10-CM | POA: Diagnosis not present

## 2017-07-05 DIAGNOSIS — Z9889 Other specified postprocedural states: Secondary | ICD-10-CM | POA: Diagnosis not present

## 2017-07-05 DIAGNOSIS — Z7982 Long term (current) use of aspirin: Secondary | ICD-10-CM | POA: Diagnosis not present

## 2017-07-05 DIAGNOSIS — R000 Tachycardia, unspecified: Secondary | ICD-10-CM | POA: Diagnosis not present

## 2017-07-05 DIAGNOSIS — T797XXA Traumatic subcutaneous emphysema, initial encounter: Secondary | ICD-10-CM | POA: Diagnosis not present

## 2017-07-05 DIAGNOSIS — R0989 Other specified symptoms and signs involving the circulatory and respiratory systems: Secondary | ICD-10-CM | POA: Diagnosis not present

## 2017-07-05 DIAGNOSIS — R9431 Abnormal electrocardiogram [ECG] [EKG]: Secondary | ICD-10-CM | POA: Diagnosis not present

## 2017-07-05 DIAGNOSIS — C3432 Malignant neoplasm of lower lobe, left bronchus or lung: Secondary | ICD-10-CM | POA: Diagnosis not present

## 2017-07-06 ENCOUNTER — Inpatient Hospital Stay: Admit: 2017-07-06 | Payer: Medicare Other | Admitting: Thoracic Surgery (Cardiothoracic Vascular Surgery)

## 2017-07-06 SURGERY — VIDEO ASSISTED THORACOSCOPY (VATS)/WEDGE RESECTION
Anesthesia: General | Site: Chest | Laterality: Left

## 2017-07-11 ENCOUNTER — Ambulatory Visit: Payer: Medicare Other | Admitting: Thoracic Surgery (Cardiothoracic Vascular Surgery)

## 2017-07-20 DIAGNOSIS — J90 Pleural effusion, not elsewhere classified: Secondary | ICD-10-CM | POA: Diagnosis not present

## 2017-07-20 DIAGNOSIS — I714 Abdominal aortic aneurysm, without rupture: Secondary | ICD-10-CM | POA: Diagnosis not present

## 2017-07-20 DIAGNOSIS — I10 Essential (primary) hypertension: Secondary | ICD-10-CM | POA: Diagnosis not present

## 2017-07-20 DIAGNOSIS — E119 Type 2 diabetes mellitus without complications: Secondary | ICD-10-CM | POA: Diagnosis not present

## 2017-07-20 DIAGNOSIS — Z9889 Other specified postprocedural states: Secondary | ICD-10-CM | POA: Diagnosis not present

## 2017-07-20 DIAGNOSIS — Z87891 Personal history of nicotine dependence: Secondary | ICD-10-CM | POA: Diagnosis not present

## 2017-07-20 DIAGNOSIS — Z09 Encounter for follow-up examination after completed treatment for conditions other than malignant neoplasm: Secondary | ICD-10-CM | POA: Diagnosis not present

## 2017-07-20 DIAGNOSIS — Z85118 Personal history of other malignant neoplasm of bronchus and lung: Secondary | ICD-10-CM | POA: Diagnosis not present

## 2017-07-20 DIAGNOSIS — C3492 Malignant neoplasm of unspecified part of left bronchus or lung: Secondary | ICD-10-CM | POA: Diagnosis not present

## 2017-07-20 DIAGNOSIS — R918 Other nonspecific abnormal finding of lung field: Secondary | ICD-10-CM | POA: Diagnosis not present

## 2017-08-04 DIAGNOSIS — H5203 Hypermetropia, bilateral: Secondary | ICD-10-CM | POA: Diagnosis not present

## 2017-08-04 DIAGNOSIS — H52223 Regular astigmatism, bilateral: Secondary | ICD-10-CM | POA: Diagnosis not present

## 2017-08-04 DIAGNOSIS — E113292 Type 2 diabetes mellitus with mild nonproliferative diabetic retinopathy without macular edema, left eye: Secondary | ICD-10-CM | POA: Diagnosis not present

## 2017-08-04 DIAGNOSIS — Z961 Presence of intraocular lens: Secondary | ICD-10-CM | POA: Diagnosis not present

## 2017-08-21 DIAGNOSIS — I10 Essential (primary) hypertension: Secondary | ICD-10-CM | POA: Diagnosis not present

## 2017-08-21 DIAGNOSIS — N183 Chronic kidney disease, stage 3 (moderate): Secondary | ICD-10-CM | POA: Diagnosis not present

## 2017-08-21 DIAGNOSIS — E119 Type 2 diabetes mellitus without complications: Secondary | ICD-10-CM | POA: Diagnosis not present

## 2017-08-21 DIAGNOSIS — E782 Mixed hyperlipidemia: Secondary | ICD-10-CM | POA: Diagnosis not present

## 2017-10-27 DIAGNOSIS — I10 Essential (primary) hypertension: Secondary | ICD-10-CM | POA: Diagnosis not present

## 2017-11-07 DIAGNOSIS — Z87891 Personal history of nicotine dependence: Secondary | ICD-10-CM | POA: Diagnosis not present

## 2017-11-07 DIAGNOSIS — K635 Polyp of colon: Secondary | ICD-10-CM | POA: Diagnosis not present

## 2017-11-07 DIAGNOSIS — Z1211 Encounter for screening for malignant neoplasm of colon: Secondary | ICD-10-CM | POA: Diagnosis not present

## 2017-11-07 DIAGNOSIS — D120 Benign neoplasm of cecum: Secondary | ICD-10-CM | POA: Diagnosis not present

## 2017-11-07 DIAGNOSIS — Z1212 Encounter for screening for malignant neoplasm of rectum: Secondary | ICD-10-CM | POA: Diagnosis not present

## 2017-11-09 DIAGNOSIS — J019 Acute sinusitis, unspecified: Secondary | ICD-10-CM | POA: Diagnosis not present

## 2017-11-09 DIAGNOSIS — B9689 Other specified bacterial agents as the cause of diseases classified elsewhere: Secondary | ICD-10-CM | POA: Diagnosis not present

## 2017-11-17 DIAGNOSIS — E119 Type 2 diabetes mellitus without complications: Secondary | ICD-10-CM | POA: Diagnosis not present

## 2017-11-17 DIAGNOSIS — E113292 Type 2 diabetes mellitus with mild nonproliferative diabetic retinopathy without macular edema, left eye: Secondary | ICD-10-CM | POA: Diagnosis not present

## 2017-11-21 DIAGNOSIS — Z902 Acquired absence of lung [part of]: Secondary | ICD-10-CM | POA: Diagnosis not present

## 2017-11-21 DIAGNOSIS — I517 Cardiomegaly: Secondary | ICD-10-CM | POA: Diagnosis not present

## 2017-11-21 DIAGNOSIS — E782 Mixed hyperlipidemia: Secondary | ICD-10-CM | POA: Diagnosis not present

## 2017-11-21 DIAGNOSIS — N400 Enlarged prostate without lower urinary tract symptoms: Secondary | ICD-10-CM | POA: Diagnosis not present

## 2017-11-21 DIAGNOSIS — K219 Gastro-esophageal reflux disease without esophagitis: Secondary | ICD-10-CM | POA: Diagnosis not present

## 2017-11-21 DIAGNOSIS — R509 Fever, unspecified: Secondary | ICD-10-CM | POA: Diagnosis not present

## 2017-11-21 DIAGNOSIS — R000 Tachycardia, unspecified: Secondary | ICD-10-CM | POA: Diagnosis not present

## 2017-11-21 DIAGNOSIS — A4189 Other specified sepsis: Secondary | ICD-10-CM | POA: Diagnosis not present

## 2017-11-21 DIAGNOSIS — A419 Sepsis, unspecified organism: Secondary | ICD-10-CM | POA: Diagnosis not present

## 2017-11-21 DIAGNOSIS — E785 Hyperlipidemia, unspecified: Secondary | ICD-10-CM | POA: Diagnosis not present

## 2017-11-21 DIAGNOSIS — R05 Cough: Secondary | ICD-10-CM | POA: Diagnosis not present

## 2017-11-21 DIAGNOSIS — Z85118 Personal history of other malignant neoplasm of bronchus and lung: Secondary | ICD-10-CM | POA: Diagnosis not present

## 2017-11-21 DIAGNOSIS — J189 Pneumonia, unspecified organism: Secondary | ICD-10-CM | POA: Diagnosis not present

## 2017-11-21 DIAGNOSIS — E1122 Type 2 diabetes mellitus with diabetic chronic kidney disease: Secondary | ICD-10-CM | POA: Diagnosis not present

## 2017-11-21 DIAGNOSIS — Z87891 Personal history of nicotine dependence: Secondary | ICD-10-CM | POA: Diagnosis not present

## 2017-11-21 DIAGNOSIS — I129 Hypertensive chronic kidney disease with stage 1 through stage 4 chronic kidney disease, or unspecified chronic kidney disease: Secondary | ICD-10-CM | POA: Diagnosis not present

## 2017-11-21 DIAGNOSIS — J40 Bronchitis, not specified as acute or chronic: Secondary | ICD-10-CM | POA: Diagnosis not present

## 2017-11-21 DIAGNOSIS — E119 Type 2 diabetes mellitus without complications: Secondary | ICD-10-CM | POA: Diagnosis not present

## 2017-11-21 DIAGNOSIS — N183 Chronic kidney disease, stage 3 (moderate): Secondary | ICD-10-CM | POA: Diagnosis not present

## 2017-11-21 DIAGNOSIS — I10 Essential (primary) hypertension: Secondary | ICD-10-CM | POA: Diagnosis not present

## 2017-12-01 DIAGNOSIS — J310 Chronic rhinitis: Secondary | ICD-10-CM | POA: Diagnosis not present

## 2017-12-04 DIAGNOSIS — Z09 Encounter for follow-up examination after completed treatment for conditions other than malignant neoplasm: Secondary | ICD-10-CM | POA: Diagnosis not present

## 2017-12-04 DIAGNOSIS — E119 Type 2 diabetes mellitus without complications: Secondary | ICD-10-CM | POA: Diagnosis not present

## 2017-12-04 DIAGNOSIS — I10 Essential (primary) hypertension: Secondary | ICD-10-CM | POA: Diagnosis not present

## 2017-12-04 DIAGNOSIS — J189 Pneumonia, unspecified organism: Secondary | ICD-10-CM | POA: Diagnosis not present

## 2017-12-04 DIAGNOSIS — E782 Mixed hyperlipidemia: Secondary | ICD-10-CM | POA: Diagnosis not present

## 2017-12-15 DIAGNOSIS — R9431 Abnormal electrocardiogram [ECG] [EKG]: Secondary | ICD-10-CM | POA: Diagnosis not present

## 2017-12-15 DIAGNOSIS — R002 Palpitations: Secondary | ICD-10-CM | POA: Diagnosis not present

## 2017-12-15 DIAGNOSIS — I517 Cardiomegaly: Secondary | ICD-10-CM | POA: Diagnosis not present

## 2017-12-15 DIAGNOSIS — Z87891 Personal history of nicotine dependence: Secondary | ICD-10-CM | POA: Diagnosis not present

## 2017-12-15 DIAGNOSIS — Z79899 Other long term (current) drug therapy: Secondary | ICD-10-CM | POA: Diagnosis not present

## 2017-12-15 DIAGNOSIS — E119 Type 2 diabetes mellitus without complications: Secondary | ICD-10-CM | POA: Diagnosis not present

## 2017-12-15 DIAGNOSIS — I472 Ventricular tachycardia: Secondary | ICD-10-CM | POA: Diagnosis not present

## 2017-12-15 DIAGNOSIS — Z7951 Long term (current) use of inhaled steroids: Secondary | ICD-10-CM | POA: Diagnosis not present

## 2017-12-15 DIAGNOSIS — I10 Essential (primary) hypertension: Secondary | ICD-10-CM | POA: Diagnosis not present

## 2017-12-15 DIAGNOSIS — Z7984 Long term (current) use of oral hypoglycemic drugs: Secondary | ICD-10-CM | POA: Diagnosis not present

## 2017-12-15 DIAGNOSIS — Z7982 Long term (current) use of aspirin: Secondary | ICD-10-CM | POA: Diagnosis not present

## 2017-12-15 DIAGNOSIS — E785 Hyperlipidemia, unspecified: Secondary | ICD-10-CM | POA: Diagnosis not present

## 2017-12-15 DIAGNOSIS — Z902 Acquired absence of lung [part of]: Secondary | ICD-10-CM | POA: Diagnosis not present

## 2017-12-15 DIAGNOSIS — I493 Ventricular premature depolarization: Secondary | ICD-10-CM | POA: Diagnosis not present

## 2018-01-03 DIAGNOSIS — I472 Ventricular tachycardia: Secondary | ICD-10-CM | POA: Diagnosis not present

## 2018-01-04 DIAGNOSIS — I472 Ventricular tachycardia: Secondary | ICD-10-CM | POA: Diagnosis not present

## 2018-01-18 DIAGNOSIS — C3492 Malignant neoplasm of unspecified part of left bronchus or lung: Secondary | ICD-10-CM | POA: Diagnosis not present

## 2018-01-18 DIAGNOSIS — R918 Other nonspecific abnormal finding of lung field: Secondary | ICD-10-CM | POA: Diagnosis not present

## 2018-01-18 DIAGNOSIS — Z9889 Other specified postprocedural states: Secondary | ICD-10-CM | POA: Diagnosis not present

## 2018-01-18 DIAGNOSIS — C3432 Malignant neoplasm of lower lobe, left bronchus or lung: Secondary | ICD-10-CM | POA: Diagnosis not present

## 2018-01-18 DIAGNOSIS — J984 Other disorders of lung: Secondary | ICD-10-CM | POA: Diagnosis not present

## 2018-01-18 DIAGNOSIS — I2510 Atherosclerotic heart disease of native coronary artery without angina pectoris: Secondary | ICD-10-CM | POA: Diagnosis not present

## 2018-02-19 DIAGNOSIS — E119 Type 2 diabetes mellitus without complications: Secondary | ICD-10-CM | POA: Diagnosis not present

## 2018-02-19 DIAGNOSIS — I10 Essential (primary) hypertension: Secondary | ICD-10-CM | POA: Diagnosis not present

## 2018-02-19 DIAGNOSIS — E782 Mixed hyperlipidemia: Secondary | ICD-10-CM | POA: Diagnosis not present

## 2018-02-19 DIAGNOSIS — N183 Chronic kidney disease, stage 3 (moderate): Secondary | ICD-10-CM | POA: Diagnosis not present

## 2018-03-23 DIAGNOSIS — E785 Hyperlipidemia, unspecified: Secondary | ICD-10-CM | POA: Diagnosis not present

## 2018-03-23 DIAGNOSIS — Z902 Acquired absence of lung [part of]: Secondary | ICD-10-CM | POA: Diagnosis not present

## 2018-03-23 DIAGNOSIS — I10 Essential (primary) hypertension: Secondary | ICD-10-CM | POA: Diagnosis not present

## 2018-03-23 DIAGNOSIS — I472 Ventricular tachycardia: Secondary | ICD-10-CM | POA: Diagnosis not present

## 2018-03-23 DIAGNOSIS — R002 Palpitations: Secondary | ICD-10-CM | POA: Diagnosis not present

## 2018-03-23 DIAGNOSIS — Z87891 Personal history of nicotine dependence: Secondary | ICD-10-CM | POA: Diagnosis not present

## 2018-03-23 DIAGNOSIS — E119 Type 2 diabetes mellitus without complications: Secondary | ICD-10-CM | POA: Diagnosis not present

## 2018-05-14 ENCOUNTER — Other Ambulatory Visit: Payer: Self-pay

## 2018-05-14 NOTE — Patient Outreach (Signed)
[facility]) Care Management  05/14/2018  Dwayne Barnett [birth year]  [id number]   Medication Adherence call to Dwayne Barnett left a message for patient to call back patient is due on Lisinopril 40 mg and Rosuvastatin 40 mg.Dwayne Barnett is showing past due under [place].  [facility] Direct Dial [phone]  Fax [phone] Lomax [email or web address]

## 2018-05-21 DIAGNOSIS — Z79899 Other long term (current) drug therapy: Secondary | ICD-10-CM | POA: Diagnosis not present

## 2018-05-21 DIAGNOSIS — I10 Essential (primary) hypertension: Secondary | ICD-10-CM | POA: Diagnosis not present

## 2018-05-21 DIAGNOSIS — E119 Type 2 diabetes mellitus without complications: Secondary | ICD-10-CM | POA: Diagnosis not present

## 2018-05-21 DIAGNOSIS — N183 Chronic kidney disease, stage 3 (moderate): Secondary | ICD-10-CM | POA: Diagnosis not present

## 2018-05-21 DIAGNOSIS — E782 Mixed hyperlipidemia: Secondary | ICD-10-CM | POA: Diagnosis not present

## 2018-07-03 DIAGNOSIS — H524 Presbyopia: Secondary | ICD-10-CM | POA: Diagnosis not present

## 2018-07-19 DIAGNOSIS — Z9889 Other specified postprocedural states: Secondary | ICD-10-CM | POA: Diagnosis not present

## 2018-07-19 DIAGNOSIS — C3432 Malignant neoplasm of lower lobe, left bronchus or lung: Secondary | ICD-10-CM | POA: Diagnosis not present

## 2018-07-30 ENCOUNTER — Telehealth: Payer: Self-pay | Admitting: Acute Care

## 2018-07-30 NOTE — Telephone Encounter (Signed)
[place] x 1 - Need to find out if pt is being followed at [facility] or where he is getting lung screening f/u)

## 2018-07-31 NOTE — Telephone Encounter (Signed)
Patient called back - states he does his lung cancer screening f/u at [facility] - he has had 2 two f/u and both were clear. He can be reached at [phone], if needed -pr

## 2018-08-03 NOTE — Telephone Encounter (Signed)
Thanks so much. Please make sure this is documented.

## 2018-08-03 NOTE — Telephone Encounter (Signed)
FYI for Dwayne Barnett - I spoke with pt.  He had lung surgery by Dr Hervey Ard at [facility] in 07/2017 and a Dr Milford Cage at [facility] has done Chest CT following surgery and his following his lung cancer screening.

## 2018-08-03 NOTE — Telephone Encounter (Signed)
[place] x 1  ( What is the name of the Dr that is following him for lung cancer screening)

## 2018-08-03 NOTE — Telephone Encounter (Signed)
Pt is calling back [phone]

## 2018-08-21 DIAGNOSIS — N183 Chronic kidney disease, stage 3 (moderate): Secondary | ICD-10-CM | POA: Diagnosis not present

## 2018-08-21 DIAGNOSIS — I10 Essential (primary) hypertension: Secondary | ICD-10-CM | POA: Diagnosis not present

## 2018-08-21 DIAGNOSIS — E782 Mixed hyperlipidemia: Secondary | ICD-10-CM | POA: Diagnosis not present

## 2018-08-21 DIAGNOSIS — E119 Type 2 diabetes mellitus without complications: Secondary | ICD-10-CM | POA: Diagnosis not present

## 2018-11-29 ENCOUNTER — Other Ambulatory Visit: Payer: Self-pay

## 2018-11-29 NOTE — Patient Outreach (Signed)
[facility] [facility]) Care Management  11/29/2018  Jary Louvier 1941/08/07 [id number]   Medication Adherence call to Mr. Nolon Bussing left a message for patient to call back patient is due on Lisinopril 40 mg and Rosuvastatin 40 mg. Mr. Wickham is showing past due under [place].   [place] Management Direct Dial [phone]  Fax [phone] [email or web address]

## 2018-12-10 DIAGNOSIS — E349 Endocrine disorder, unspecified: Secondary | ICD-10-CM | POA: Diagnosis not present

## 2019-03-19 DIAGNOSIS — E1165 Type 2 diabetes mellitus with hyperglycemia: Secondary | ICD-10-CM | POA: Diagnosis not present

## 2019-04-15 DIAGNOSIS — N183 Chronic kidney disease, stage 3 (moderate): Secondary | ICD-10-CM | POA: Diagnosis not present

## 2019-04-15 DIAGNOSIS — E1122 Type 2 diabetes mellitus with diabetic chronic kidney disease: Secondary | ICD-10-CM | POA: Diagnosis not present

## 2019-04-15 DIAGNOSIS — I10 Essential (primary) hypertension: Secondary | ICD-10-CM | POA: Diagnosis not present

## 2019-04-15 DIAGNOSIS — E1165 Type 2 diabetes mellitus with hyperglycemia: Secondary | ICD-10-CM | POA: Diagnosis not present

## 2019-04-15 DIAGNOSIS — Z79899 Other long term (current) drug therapy: Secondary | ICD-10-CM | POA: Diagnosis not present

## 2019-04-15 DIAGNOSIS — E782 Mixed hyperlipidemia: Secondary | ICD-10-CM | POA: Diagnosis not present

## 2019-04-15 DIAGNOSIS — Z0000 Encounter for general adult medical examination without abnormal findings: Secondary | ICD-10-CM | POA: Diagnosis not present

## 2019-04-18 DIAGNOSIS — Z7984 Long term (current) use of oral hypoglycemic drugs: Secondary | ICD-10-CM | POA: Diagnosis not present

## 2019-04-18 DIAGNOSIS — I10 Essential (primary) hypertension: Secondary | ICD-10-CM | POA: Diagnosis not present

## 2019-04-18 DIAGNOSIS — E119 Type 2 diabetes mellitus without complications: Secondary | ICD-10-CM | POA: Diagnosis not present

## 2019-04-18 DIAGNOSIS — J219 Acute bronchiolitis, unspecified: Secondary | ICD-10-CM | POA: Diagnosis not present

## 2019-04-18 DIAGNOSIS — C3432 Malignant neoplasm of lower lobe, left bronchus or lung: Secondary | ICD-10-CM | POA: Diagnosis not present

## 2019-04-18 DIAGNOSIS — Z79899 Other long term (current) drug therapy: Secondary | ICD-10-CM | POA: Diagnosis not present

## 2019-04-24 DIAGNOSIS — E230 Hypopituitarism: Secondary | ICD-10-CM | POA: Diagnosis not present

## 2019-04-24 DIAGNOSIS — N8184 Pelvic muscle wasting: Secondary | ICD-10-CM | POA: Diagnosis not present

## 2019-05-09 DIAGNOSIS — I714 Abdominal aortic aneurysm, without rupture: Secondary | ICD-10-CM | POA: Diagnosis not present

## 2019-05-21 DIAGNOSIS — M6281 Muscle weakness (generalized): Secondary | ICD-10-CM | POA: Diagnosis not present

## 2019-06-04 DIAGNOSIS — N8184 Pelvic muscle wasting: Secondary | ICD-10-CM | POA: Diagnosis not present

## 2019-06-04 DIAGNOSIS — M62838 Other muscle spasm: Secondary | ICD-10-CM | POA: Diagnosis not present

## 2019-06-04 DIAGNOSIS — M6281 Muscle weakness (generalized): Secondary | ICD-10-CM | POA: Diagnosis not present

## 2019-06-04 DIAGNOSIS — R3915 Urgency of urination: Secondary | ICD-10-CM | POA: Diagnosis not present

## 2019-06-10 DIAGNOSIS — R3915 Urgency of urination: Secondary | ICD-10-CM | POA: Diagnosis not present

## 2019-06-10 DIAGNOSIS — M6281 Muscle weakness (generalized): Secondary | ICD-10-CM | POA: Diagnosis not present

## 2019-06-10 DIAGNOSIS — M62838 Other muscle spasm: Secondary | ICD-10-CM | POA: Diagnosis not present

## 2019-06-18 DIAGNOSIS — R3915 Urgency of urination: Secondary | ICD-10-CM | POA: Diagnosis not present

## 2019-06-18 DIAGNOSIS — N8184 Pelvic muscle wasting: Secondary | ICD-10-CM | POA: Diagnosis not present

## 2019-06-18 DIAGNOSIS — M6281 Muscle weakness (generalized): Secondary | ICD-10-CM | POA: Diagnosis not present

## 2019-06-18 DIAGNOSIS — M62838 Other muscle spasm: Secondary | ICD-10-CM | POA: Diagnosis not present

## 2019-06-27 DIAGNOSIS — I714 Abdominal aortic aneurysm, without rupture: Secondary | ICD-10-CM | POA: Diagnosis not present

## 2019-07-16 DIAGNOSIS — M6281 Muscle weakness (generalized): Secondary | ICD-10-CM | POA: Diagnosis not present

## 2019-07-16 DIAGNOSIS — M62838 Other muscle spasm: Secondary | ICD-10-CM | POA: Diagnosis not present

## 2019-07-16 DIAGNOSIS — R3915 Urgency of urination: Secondary | ICD-10-CM | POA: Diagnosis not present

## 2019-07-17 DIAGNOSIS — E230 Hypopituitarism: Secondary | ICD-10-CM | POA: Diagnosis not present

## 2019-07-22 DIAGNOSIS — N183 Chronic kidney disease, stage 3 (moderate): Secondary | ICD-10-CM | POA: Diagnosis not present

## 2019-07-22 DIAGNOSIS — I129 Hypertensive chronic kidney disease with stage 1 through stage 4 chronic kidney disease, or unspecified chronic kidney disease: Secondary | ICD-10-CM | POA: Diagnosis not present

## 2019-07-22 DIAGNOSIS — E1122 Type 2 diabetes mellitus with diabetic chronic kidney disease: Secondary | ICD-10-CM | POA: Diagnosis not present

## 2019-07-22 DIAGNOSIS — E782 Mixed hyperlipidemia: Secondary | ICD-10-CM | POA: Diagnosis not present

## 2019-12-26 ENCOUNTER — Other Ambulatory Visit: Payer: Self-pay | Admitting: Urology

## 2019-12-26 DIAGNOSIS — R972 Elevated prostate specific antigen [PSA]: Secondary | ICD-10-CM

## 2019-12-26 DIAGNOSIS — C61 Malignant neoplasm of prostate: Secondary | ICD-10-CM

## 2020-01-21 ENCOUNTER — Other Ambulatory Visit: Payer: Self-pay

## 2020-01-21 ENCOUNTER — Ambulatory Visit
Admission: RE | Admit: 2020-01-21 | Discharge: 2020-01-21 | Disposition: A | Discharge status: Home / Self Care | Payer: Medicare Other | Source: Ambulatory Visit | Attending: Urology | Admitting: Urology

## 2020-01-21 DIAGNOSIS — C61 Malignant neoplasm of prostate: Secondary | ICD-10-CM

## 2020-01-21 DIAGNOSIS — R972 Elevated prostate specific antigen [PSA]: Secondary | ICD-10-CM

## 2020-01-21 MED ORDER — GADOBENATE DIMEGLUMINE 529 MG/ML IV SOLN
19.0000 mL | Freq: Once | INTRAVENOUS | Status: AC | PRN
  Administered 2020-01-21: 19 mL via INTRAVENOUS
